# Patient Record
Sex: Female | Born: 1975
Health system: Southern US, Community
[De-identification: ages and names within clinical notes are randomized; demographics above are authoritative.]

## PROBLEM LIST (undated history)

## (undated) DIAGNOSIS — R51 Headache: Secondary | ICD-10-CM

## (undated) DIAGNOSIS — E7800 Pure hypercholesterolemia, unspecified: Secondary | ICD-10-CM

## (undated) DIAGNOSIS — G35D Multiple sclerosis, unspecified: Secondary | ICD-10-CM

## (undated) DIAGNOSIS — F419 Anxiety disorder, unspecified: Secondary | ICD-10-CM

## (undated) DIAGNOSIS — T7840XA Allergy, unspecified, initial encounter: Secondary | ICD-10-CM

## (undated) DIAGNOSIS — K219 Gastro-esophageal reflux disease without esophagitis: Secondary | ICD-10-CM

## (undated) DIAGNOSIS — R519 Headache, unspecified: Secondary | ICD-10-CM

## (undated) HISTORY — DX: Anxiety disorder, unspecified: F41.9

## (undated) HISTORY — DX: Gastro-esophageal reflux disease without esophagitis: K21.9

## (undated) HISTORY — DX: Headache, unspecified: R51.9

## (undated) HISTORY — DX: Allergy, unspecified, initial encounter: T78.40XA

## (undated) HISTORY — DX: Multiple sclerosis, unspecified: G35.D

## (undated) HISTORY — DX: Headache: R51

## (undated) HISTORY — DX: Pure hypercholesterolemia, unspecified: E78.00

---

## 2008-06-07 HISTORY — PX: LAPAROTOMY: SHX154

## 2008-11-28 ENCOUNTER — Ambulatory Visit: Payer: Self-pay | Admitting: Family Medicine

## 2008-11-29 ENCOUNTER — Ambulatory Visit: Payer: Self-pay | Admitting: Family Medicine

## 2008-12-03 ENCOUNTER — Ambulatory Visit: Payer: Self-pay | Admitting: Gynecologic Oncology

## 2008-12-05 ENCOUNTER — Ambulatory Visit: Payer: Self-pay | Admitting: Gynecologic Oncology

## 2008-12-13 ENCOUNTER — Ambulatory Visit: Payer: Self-pay | Admitting: Gynecologic Oncology

## 2008-12-16 ENCOUNTER — Ambulatory Visit: Payer: Self-pay | Admitting: Gynecologic Oncology

## 2008-12-17 ENCOUNTER — Inpatient Hospital Stay: Payer: Self-pay | Admitting: Surgery

## 2009-01-05 ENCOUNTER — Ambulatory Visit: Payer: Self-pay | Admitting: Gynecologic Oncology

## 2009-01-07 ENCOUNTER — Ambulatory Visit: Payer: Self-pay | Admitting: Gynecologic Oncology

## 2009-04-23 ENCOUNTER — Ambulatory Visit: Payer: Self-pay | Admitting: Family Medicine

## 2009-07-08 ENCOUNTER — Ambulatory Visit: Payer: Self-pay | Admitting: Gynecologic Oncology

## 2009-08-05 ENCOUNTER — Ambulatory Visit: Payer: Self-pay | Admitting: Gynecologic Oncology

## 2010-01-05 ENCOUNTER — Ambulatory Visit: Payer: Self-pay | Admitting: Gynecologic Oncology

## 2010-01-06 ENCOUNTER — Ambulatory Visit: Payer: Self-pay | Admitting: Gynecologic Oncology

## 2010-02-05 ENCOUNTER — Ambulatory Visit: Payer: Self-pay | Admitting: Gynecologic Oncology

## 2011-01-12 ENCOUNTER — Ambulatory Visit: Payer: Self-pay | Admitting: Gynecologic Oncology

## 2011-02-06 ENCOUNTER — Ambulatory Visit: Payer: Self-pay | Admitting: Gynecologic Oncology

## 2011-04-21 ENCOUNTER — Ambulatory Visit: Payer: Self-pay

## 2011-08-30 ENCOUNTER — Other Ambulatory Visit: Payer: Self-pay | Admitting: Physician Assistant

## 2012-01-18 ENCOUNTER — Ambulatory Visit: Payer: Self-pay | Admitting: Gynecologic Oncology

## 2012-02-06 ENCOUNTER — Ambulatory Visit: Payer: Self-pay | Admitting: Gynecologic Oncology

## 2013-01-05 ENCOUNTER — Ambulatory Visit: Payer: Self-pay | Admitting: Gynecologic Oncology

## 2013-02-06 ENCOUNTER — Ambulatory Visit: Payer: Self-pay | Admitting: Gynecologic Oncology

## 2013-03-07 ENCOUNTER — Ambulatory Visit: Payer: Self-pay | Admitting: Gynecologic Oncology

## 2013-05-11 ENCOUNTER — Other Ambulatory Visit: Payer: Self-pay | Admitting: Physician Assistant

## 2013-05-11 LAB — URINALYSIS, COMPLETE
Bilirubin,UR: NEGATIVE
Leukocyte Esterase: NEGATIVE
Ph: 5 (ref 4.5–8.0)
Protein: NEGATIVE
RBC,UR: 2 /HPF (ref 0–5)
Squamous Epithelial: 18
WBC UR: 4 /HPF (ref 0–5)

## 2015-07-31 ENCOUNTER — Telehealth: Payer: 59 | Admitting: Physician Assistant

## 2015-07-31 DIAGNOSIS — R399 Unspecified symptoms and signs involving the genitourinary system: Secondary | ICD-10-CM | POA: Diagnosis not present

## 2015-07-31 MED ORDER — FLUCONAZOLE 150 MG PO TABS: 150.0000 mg | ORAL_TABLET | Freq: Once | ORAL | Status: DC

## 2015-07-31 MED ORDER — CIPROFLOXACIN HCL 500 MG PO TABS: 500.0000 mg | ORAL_TABLET | Freq: Two times a day (BID) | ORAL | Status: DC

## 2015-07-31 MED FILL — FLUCONAZOLE 150 MG TABLET: 150 | 1 days supply | Qty: 1 | Fill #0

## 2015-07-31 MED FILL — CIPROFLOXACIN HCL 500 MG TA: 500 | 5 days supply | Qty: 10 | Fill #0

## 2015-07-31 NOTE — Progress Notes (Signed)
We are sorry that you are not feeling well.  Here is how we plan to help!  Based on what you shared with me it looks like you most likely have a simple urinary tract infection.  A UTI (Urinary Tract Infection) is a bacterial infection of the bladder.  Most cases of urinary tract infections are simple to treat but a key part of your care is to encourage you to drink plenty of fluids and watch your symptoms carefully.  I have prescribed Ciprofloxacin 500 mg twice a day for 5 days.  Your symptoms should gradually improve. Call us if the burning in your urine worsens, you develop worsening fever, back pain or pelvic pain or if your symptoms do not resolve after completing the antibiotic. I have also sent in a Diflucan to help prevent yeast from antibiotic use.  Urinary tract infections can be prevented by drinking plenty of water to keep your body hydrated.  Also be sure when you wipe, wipe from front to back and don't hold it in!  If possible, empty your bladder every 4 hours.  Your e-visit answers were reviewed by a board certified advanced clinical practitioner to complete your personal care plan.  Depending on the condition, your plan could have included both over the counter or prescription medications.  If there is a problem please reply  once you have received a response from your provider.  Your safety is important to [place].  If you have drug allergies check your prescription carefully.    You can use MyChart to ask questions about today's visit, request a non-urgent call back, or ask for a work or school excuse for 24 hours related to this e-Visit. If it has been greater than 24 hours you will need to follow up with your provider, or enter a new e-Visit to address those concerns.   You will get an e-mail in the next two days asking about your experience.  I hope that your e-visit has been valuable and will speed your recovery. Thank you for using e-visits.

## 2015-08-01 DIAGNOSIS — H5203 Hypermetropia, bilateral: Secondary | ICD-10-CM | POA: Diagnosis not present

## 2015-09-15 ENCOUNTER — Telehealth: Payer: 59 | Admitting: Family

## 2015-09-15 DIAGNOSIS — J019 Acute sinusitis, unspecified: Secondary | ICD-10-CM | POA: Diagnosis not present

## 2015-09-15 MED ORDER — AMOXICILLIN-POT CLAVULANATE 875-125 MG PO TABS: 1.0000 | ORAL_TABLET | Freq: Two times a day (BID) | ORAL | Status: DC

## 2015-09-15 MED ORDER — FLUCONAZOLE 150 MG PO TABS: 150.0000 mg | ORAL_TABLET | Freq: Once | ORAL | Status: DC

## 2015-09-15 MED FILL — AMOX TR-K CLV 875-125 MG TA: 875-125 | 7 days supply | Qty: 14 | Fill #0

## 2015-09-15 MED FILL — FLUCONAZOLE 150 MG TABLET: 150 | 1 days supply | Qty: 1 | Fill #0

## 2015-09-15 NOTE — Addendum Note (Signed)
Addended by: Evelina Dun A on: 09/15/2015 10:16 AM   Modules accepted: Orders

## 2015-09-15 NOTE — Progress Notes (Signed)

## 2015-10-31 ENCOUNTER — Encounter: Payer: Self-pay | Admitting: Physician Assistant

## 2015-10-31 ENCOUNTER — Ambulatory Visit (INDEPENDENT_AMBULATORY_CARE_PROVIDER_SITE_OTHER): Payer: 59 | Admitting: Physician Assistant

## 2015-10-31 VITALS — BP 108/70 | HR 93 | Temp 98.2°F | Resp 16 | Wt 164.4 lb

## 2015-10-31 DIAGNOSIS — Z7189 Other specified counseling: Secondary | ICD-10-CM | POA: Diagnosis not present

## 2015-10-31 DIAGNOSIS — Z0000 Encounter for general adult medical examination without abnormal findings: Secondary | ICD-10-CM

## 2015-10-31 DIAGNOSIS — E7800 Pure hypercholesterolemia, unspecified: Secondary | ICD-10-CM

## 2015-10-31 DIAGNOSIS — G5601 Carpal tunnel syndrome, right upper limb: Secondary | ICD-10-CM | POA: Diagnosis not present

## 2015-10-31 DIAGNOSIS — A499 Bacterial infection, unspecified: Secondary | ICD-10-CM

## 2015-10-31 DIAGNOSIS — Z136 Encounter for screening for cardiovascular disorders: Secondary | ICD-10-CM | POA: Diagnosis not present

## 2015-10-31 DIAGNOSIS — Z7689 Persons encountering health services in other specified circumstances: Secondary | ICD-10-CM

## 2015-10-31 DIAGNOSIS — R6889 Other general symptoms and signs: Secondary | ICD-10-CM | POA: Diagnosis not present

## 2015-10-31 DIAGNOSIS — Z13220 Encounter for screening for lipoid disorders: Secondary | ICD-10-CM

## 2015-10-31 DIAGNOSIS — N760 Acute vaginitis: Secondary | ICD-10-CM

## 2015-10-31 DIAGNOSIS — B9689 Other specified bacterial agents as the cause of diseases classified elsewhere: Secondary | ICD-10-CM

## 2015-10-31 NOTE — Patient Instructions (Signed)

## 2015-10-31 NOTE — Progress Notes (Signed)
Patient: Erica Fowler Female    DOB: [birth year]   40 y.o.   MRN: [id number] Visit Date: 10/31/2015  Today's Provider: Mar Daring, PA-C   Chief Complaint  Patient presents with  . New Patient (Initial Visit)   Subjective:    HPI Patient comes in today to establish care as a new patient. She has been seen by Gennie Alma, NP at [facility].   She also has concerns about her right hand hurting. She reports that she is in front of the computer the whole day. In the past few weeks her hand hurts sometimes and she reports feeling tingling and numbness over the 1st - 3rd fingers. This has awoken her at night. She also sleeps with her arm above her head and has noticed that this makes it worse. She also reports arm numbness occasionally when sleeping that way, "sometimes I can't lift my arm off the pillow when I wake up." Her hand bothers her the most when driving.  She does also suffer from seasonal allergies. She uses loratadine as needed for allergy symptoms.   Heat intolerance and fatigue she questions if she may be having menopausal signs. The heat intolerance if mostly if she is outdoors on a hot day. She will have to go back inside because she gets so uncomfortable.   She also reports having frequent episodes of BV that occur most often following intercourse. She was given metrogel by Gennie Alma, NP for her to use as needed when she has an episode. This has been helping.    Allergies  Allergen Reactions  . Sulfa Antibiotics    Previous Medications   ACETAMINOPHEN (TYLENOL) 500 MG TABLET    Take 500 mg by mouth every 6 (six) hours as needed.   CYANOCOBALAMIN (VITAMIN B 12 PO)    Take by mouth.   LORATADINE (CLARITIN) 10 MG TABLET    Take 10 mg by mouth daily.   PSEUDOEPHEDRINE HCL (SUDAFED PO)    Take by mouth.    Review of Systems  Constitutional: Positive for fatigue.  HENT: Positive for sneezing. Negative for congestion, ear pain, sinus pressure and  sore throat.   Eyes: Positive for discharge.  Respiratory: Negative.   Cardiovascular: Negative.   Gastrointestinal: Negative.   Endocrine: Positive for heat intolerance.  Genitourinary: Negative.   Musculoskeletal: Negative.   Allergic/Immunologic: Positive for environmental allergies.  Neurological: Positive for light-headedness, numbness (right hand) and headaches. Negative for dizziness and weakness.  Hematological: Negative.   Psychiatric/Behavioral: Negative.     Social History  Substance Use Topics  . Smoking status: Current Every Day Smoker -- 0.50 packs/day    Types: Cigarettes  . Smokeless tobacco: Not on file  . Alcohol Use: Yes     Comment: occasional   Objective:   BP 108/70 mmHg  Pulse 93  Temp(Src) 98.2 F (36.8 C) (Oral)  Resp 16  Wt 164 lb 6.4 oz (74.571 kg)  Physical Exam  Constitutional: She is oriented to person, place, and time. She appears well-developed and well-nourished. No distress.  HENT:  Head: Normocephalic and atraumatic.  Right Ear: External ear normal.  Left Ear: External ear normal.  Nose: Nose normal.  Mouth/Throat: Oropharynx is clear and moist. No oropharyngeal exudate.  Eyes: Conjunctivae and EOM are normal. Pupils are equal, round, and reactive to light. Right eye exhibits no discharge. Left eye exhibits no discharge. No scleral icterus.  Neck: Normal range of motion. Neck supple. No JVD  present. No tracheal deviation present. No thyromegaly present.  Cardiovascular: Normal rate, regular rhythm, normal heart sounds and intact distal pulses.  Exam reveals no gallop and no friction rub.   No murmur heard. Pulmonary/Chest: Effort normal and breath sounds normal. No respiratory distress. She has no wheezes. She has no rales. She exhibits no tenderness.  Abdominal: Soft. Bowel sounds are normal. She exhibits no distension and no mass. There is no tenderness. There is no rebound and no guarding.  Musculoskeletal: Normal range of motion. She  exhibits no edema or tenderness.       Right wrist: Normal.       Left wrist: Normal.       Right hand: Normal.       Left hand: Normal.  + Tinel and Phalen on right hand  Lymphadenopathy:    She has no cervical adenopathy.  Neurological: She is alert and oriented to person, place, and time.  Skin: Skin is warm and dry. No rash noted. She is not diaphoretic.  Psychiatric: She has a normal mood and affect. Her behavior is normal. Judgment and thought content normal.  Vitals reviewed.       Assessment & Plan:     1. Establishing care with new doctor, encounter for No previous PCP. Previously seen by Gennie Alma, NP at [facility]. We'll check labs being that she has not had any labs checked in a long time and I will follow-up with her pending these results. If labs are within normal limits and stable I will see her back in 6 months for her annual physical exam. - CBC with Differential - Comprehensive Metabolic Panel (CMET)  2. Carpal tunnel syndrome, right Will start with conservative therapy of wrist brace (cock-up splint) for her to wear at nighttime. She may use his brace as well during the day when she has to be at her computer a lot. She may also use anti-inflammatories as needed. She is to call the office if symptoms do not improve with conservative therapy or if they worsen. Will consider referral to orthopedics for nerve conduction study and further evaluation of carpal tunnel if no improvement.  3. Heat intolerance I will check thyroid level as below to make sure that her thyroid is not playing a factor into her heat intolerance. I will follow-up with her pending these results. - TSH  4. Encounter for lipid screening for cardiovascular disease Family history of cardiovascular disease and elevated cholesterol. I will check her cholesterol level as below and follow-up pending these results. - Lipid Profile  5. BV (bacterial vaginosis) Currently stable. She does use  MetroGel as needed for any flareups. She is to call the office if she has any issues or worsening symptoms. - metroNIDAZOLE (METROGEL VAGINAL) 0.75 % vaginal gel; Place 1 Applicatorful vaginally 2 (two) times daily.  Dispense: 70 g; Refill: 0       Mar Daring, PA-C  [facility]

## 2015-11-01 LAB — COMPREHENSIVE METABOLIC PANEL
A/G RATIO: 1.6 (ref 1.2–2.2)
ALT: 19 IU/L (ref 0–32)
AST: 20 IU/L (ref 0–40)
Albumin: 4.6 g/dL (ref 3.5–5.5)
Alkaline Phosphatase: 49 IU/L (ref 39–117)
BILIRUBIN TOTAL: 0.3 mg/dL (ref 0.0–1.2)
BUN / CREAT RATIO: 15 (ref 9–23)
BUN: 11 mg/dL (ref 6–20)
CHLORIDE: 100 mmol/L (ref 96–106)
CO2: 23 mmol/L (ref 18–29)
Calcium: 9.6 mg/dL (ref 8.7–10.2)
Creatinine, Ser: 0.71 mg/dL (ref 0.57–1.00)
GFR calc non Af Amer: 108 mL/min/{1.73_m2} (ref >59)
GFR, EST AFRICAN AMERICAN: 125 mL/min/{1.73_m2} (ref >59)
GLOBULIN, TOTAL: 2.9 g/dL (ref 1.5–4.5)
Glucose: 89 mg/dL (ref 65–99)
POTASSIUM: 4.2 mmol/L (ref 3.5–5.2)
SODIUM: 140 mmol/L (ref 134–144)
TOTAL PROTEIN: 7.5 g/dL (ref 6.0–8.5)

## 2015-11-01 LAB — CBC WITH DIFFERENTIAL/PLATELET
BASOS ABS: 0 10*3/uL (ref 0.0–0.2)
Basos: 0 %
EOS (ABSOLUTE): 0.1 10*3/uL (ref 0.0–0.4)
Eos: 2 %
Hematocrit: 44.9 % (ref 34.0–46.6)
Hemoglobin: 14.9 g/dL (ref 11.1–15.9)
IMMATURE GRANS (ABS): 0 10*3/uL (ref 0.0–0.1)
IMMATURE GRANULOCYTES: 0 %
LYMPHS: 26 %
Lymphocytes Absolute: 2 10*3/uL (ref 0.7–3.1)
MCH: 28.6 pg (ref 26.6–33.0)
MCHC: 33.2 g/dL (ref 31.5–35.7)
MCV: 86 fL (ref 79–97)
Monocytes Absolute: 0.5 10*3/uL (ref 0.1–0.9)
Monocytes: 7 %
NEUTROS ABS: 4.9 10*3/uL (ref 1.4–7.0)
NEUTROS PCT: 65 %
PLATELETS: 311 10*3/uL (ref 150–379)
RBC: 5.21 x10E6/uL (ref 3.77–5.28)
RDW: 13.6 % (ref 12.3–15.4)
WBC: 7.6 10*3/uL (ref 3.4–10.8)

## 2015-11-01 LAB — LIPID PANEL
CHOL/HDL RATIO: 5 ratio — AB (ref 0.0–4.4)
Cholesterol, Total: 210 mg/dL — ABNORMAL HIGH (ref 100–199)
HDL: 42 mg/dL (ref >39)
LDL CALC: 141 mg/dL — AB (ref 0–99)
TRIGLYCERIDES: 135 mg/dL (ref 0–149)
VLDL Cholesterol Cal: 27 mg/dL (ref 5–40)

## 2015-11-01 LAB — TSH: TSH: 1.28 u[IU]/mL (ref 0.450–4.500)

## 2015-11-04 ENCOUNTER — Encounter: Payer: Self-pay | Admitting: Physician Assistant

## 2015-11-04 ENCOUNTER — Telehealth: Payer: Self-pay

## 2015-11-04 MED ORDER — SIMVASTATIN 10 MG PO TABS
10.0000 mg | ORAL_TABLET | Freq: Every day | ORAL | Status: DC
Start: 2015-11-04 — End: 2016-05-07

## 2015-11-04 NOTE — Addendum Note (Signed)
Addended by: Mar Daring on: 11/04/2015 09:02 AM   Modules accepted: Orders

## 2015-11-04 NOTE — Telephone Encounter (Signed)
-----   Message from Mar Daring, [place] sent at 11/04/2015  9:01 AM EDT ----- All labs are within normal limits and stable with exception of cholesterol which is elevated. I do recommend starting a low dose cholesterol lowering medication at this time since your cardiovascular event risk is elevated to almost 2x higher than average risk with your current numbers. I will send this in to the pharmacy on file and we will recheck cholesterol in 6 months. Also make sure to limit foods high in fat and cholesterol from diet and add physical activity to equal 150 min/week minimum as advised by the [employer].   Thanks! -JB

## 2015-11-04 NOTE — Telephone Encounter (Signed)
Patient was advised as below. Patient reports that she does not want to start a cholesterol medication at this time. She reports that she will also inform you via My Chart.

## 2015-11-04 NOTE — Telephone Encounter (Signed)
Responded via mychart. Will try fish oil or krill oil with lifestyle changes and recheck in 6 months.

## 2015-11-21 ENCOUNTER — Telehealth: Payer: Self-pay

## 2015-11-21 NOTE — Telephone Encounter (Signed)
Patient called and states she was here on May 26th to get established with [facility]. She thought this was a CPE also and that her labs would be covered but she just got a bill for $230 for office visit and labs they were not covered. Patient did not realize this was going to happen she thought this was a CPE. Per office note it was get established and discussed issues she was having but the labs that were ordered are usually for CPE and she thought that's what she had. I advised patient I would check with manager to see how to proceed because I am not sure of the steps and if we are able to correct this. Please review-aa

## 2015-11-28 ENCOUNTER — Encounter: Payer: Self-pay | Admitting: Physician Assistant

## 2015-11-28 NOTE — Telephone Encounter (Signed)
  Updated code. Will see. If no change call [facility].

## 2015-11-28 NOTE — Addendum Note (Signed)
Addended by: Mar Daring on: 11/28/2015 10:43 AM   Modules accepted: Level of Service

## 2016-01-14 ENCOUNTER — Telehealth: Payer: 59 | Admitting: Nurse Practitioner

## 2016-01-14 DIAGNOSIS — J0100 Acute maxillary sinusitis, unspecified: Secondary | ICD-10-CM | POA: Diagnosis not present

## 2016-01-14 DIAGNOSIS — J038 Acute tonsillitis due to other specified organisms: Secondary | ICD-10-CM | POA: Diagnosis not present

## 2016-01-14 MED ORDER — AMOXICILLIN-POT CLAVULANATE 875-125 MG PO TABS: 1.0000 | ORAL_TABLET | Freq: Two times a day (BID) | ORAL | 0 refills | Status: DC

## 2016-01-14 MED ORDER — FLUCONAZOLE 150 MG PO TABS: 150.0000 mg | ORAL_TABLET | Freq: Once | ORAL | 0 refills | Status: AC

## 2016-01-14 NOTE — Progress Notes (Signed)

## 2016-01-14 NOTE — Addendum Note (Signed)
Addended by: Chevis Pretty on: 01/14/2016 09:55 AM   Modules accepted: Orders

## 2016-04-21 ENCOUNTER — Telehealth: Payer: Self-pay | Admitting: Physician Assistant

## 2016-04-21 NOTE — Telephone Encounter (Signed)
Yes she can be scheduled as a follow-up for bacterial vaginosis and I can collect Pap at that time.

## 2016-04-21 NOTE — Telephone Encounter (Signed)
Pt called to schedule PAP with Tawanna Sat and stated that Tawanna Sat advised her at her new pt OV on 10/31/15 she could come back in for her PAP. I asked pt if she was wanting to schedule a CPE. Pt stated her CPE was done at the 10/31/15 visit. After looking at the [place] note with Joseline, I advised that the visit was establishing care and she was expected to come in for CPE 6 months after visit. Pt advised that she had trouble with the way the claim was coded for that visit and thinks it was changed to CPE. I advised pt I would send back a message and see how we needed to schedule this. Pt also advised that she thinks she might have vaginosis. I offered pt a regular office visit to discuss this and she stated that she couldn't come in tomorrow or Friday and she would just wait for the call back.  Arbie Cookey looked at the visit and it was changed to CPE. How should this be scheduled? Can we just see pt for the current issue and include a PAP? Please advise. Thanks TNP

## 2016-04-21 NOTE — Telephone Encounter (Signed)
Called pt to schedule appt. Pt stated that she has an appt with OBGYN to discuss vaginosis and would like Jenni to do a PAP. I went ahead and scheduled PAP for 05/07/16. Thanks TNP

## 2016-04-21 NOTE — Telephone Encounter (Signed)
Please review.  Thanks,  -Joseline 

## 2016-04-21 NOTE — Telephone Encounter (Signed)
Tried calling pt. No answer and voicemail not set up. Will try again later. Thanks TNP

## 2016-04-22 ENCOUNTER — Encounter: Payer: Self-pay | Admitting: Obstetrics and Gynecology

## 2016-04-22 ENCOUNTER — Ambulatory Visit (INDEPENDENT_AMBULATORY_CARE_PROVIDER_SITE_OTHER): Payer: 59 | Admitting: Obstetrics and Gynecology

## 2016-04-22 VITALS — BP 116/77 | HR 86 | Wt 157.9 lb

## 2016-04-22 DIAGNOSIS — B9689 Other specified bacterial agents as the cause of diseases classified elsewhere: Secondary | ICD-10-CM

## 2016-04-22 DIAGNOSIS — N760 Acute vaginitis: Secondary | ICD-10-CM

## 2016-04-22 MED ORDER — HYLAFEM VA SUPP: 1.0000 | VAGINAL | 4 refills | Status: DC | PRN

## 2016-04-22 NOTE — Progress Notes (Signed)
Subjective:     Patient ID: Erica Fowler, female   DOB: February 10, 1976, 40 y.o.   MRN: [id number]  HPI Reports increase in vaginal discharge with slight odor x 1 week. Has been using Metrogel since May for recurrent BV (noted after menses), but then gets a yeast infection. States menses irregular and light since IUD placed 3 years ago. Denies pain or bleeding with sex, urinary symptoms or changes.  Review of Systems Negative except stated in HPI.    Objective:   Physical Exam A&O x4 Well groomed female in no distress Blood pressure 116/77, pulse 86, weight 157 lb 14.4 oz (71.6 kg). Pelvic exam: normal external genitalia, vulva, vagina, cervix, uterus and adnexa, CERVIX: normal appearing cervix without discharge or lesions, cervical discharge present - white, scant and thin, IUD string noted, WET MOUNT done - results: negative for pathogens, normal epithelial cells, KOH done, clue cells.     Assessment:     BV    Plan:     Hylefem capsule sample given and instructed on use, recommend trying it after menses and prn to see if better resolution without causing yeast infections. RTC as needed.  Melody [place], CNM

## 2016-05-07 ENCOUNTER — Encounter: Payer: Self-pay | Admitting: Physician Assistant

## 2016-05-07 ENCOUNTER — Ambulatory Visit (INDEPENDENT_AMBULATORY_CARE_PROVIDER_SITE_OTHER): Payer: 59 | Admitting: Physician Assistant

## 2016-05-07 VITALS — BP 114/80 | HR 84 | Temp 98.2°F | Resp 16 | Wt 153.0 lb

## 2016-05-07 DIAGNOSIS — Z124 Encounter for screening for malignant neoplasm of cervix: Secondary | ICD-10-CM | POA: Diagnosis not present

## 2016-05-07 NOTE — Patient Instructions (Signed)

## 2016-05-07 NOTE — Progress Notes (Signed)
Patient: Erica Fowler Female    DOB: [birth year]   40 y.o.   MRN: [id number] Visit Date: 05/07/2016  Today's Provider: Mar Daring, PA-C   Chief Complaint  Patient presents with  . Cervical Cancer Screening   Subjective:    HPI   Cervical Cancer Screening Pt comes in today for a pap smear. Pt reports she has never had an abnormal pap. Pt believes her last pap was January 2016. Pt sees [facility], Melody Shambley, CNM, who follows pt's vaginitis.  She was recently started on boric acid suppositories. Since starting the suppositories she has noticed improvement in symptoms.                          Allergies  Allergen Reactions  . Sulfa Antibiotics      Current Outpatient Prescriptions:  .  acetaminophen (TYLENOL) 500 MG tablet, Take 500 mg by mouth every 6 (six) hours as needed., Disp: , Rfl:  .  Cyanocobalamin (VITAMIN B 12 PO), Take by mouth., Disp: , Rfl:  .  Homeopathic Products (HYLAFEM) SUPP, Place 1 capsule vaginally as needed., Disp: 6 suppository, Rfl: 4 .  levonorgestrel (MIRENA) 20 MCG/24HR IUD, 1 each by Intrauterine route once., Disp: , Rfl:  .  loratadine (CLARITIN) 10 MG tablet, Take 10 mg by mouth daily., Disp: , Rfl:  .  Pseudoephedrine HCl (SUDAFED PO), Take by mouth., Disp: , Rfl:   Review of Systems  Constitutional: Negative for activity change, appetite change, chills, diaphoresis, fatigue, fever and unexpected weight change.  Respiratory: Negative for cough, chest tightness and shortness of breath.   Cardiovascular: Negative for chest pain, palpitations and leg swelling.  Gastrointestinal: Negative for abdominal pain.  Genitourinary: Negative for dysuria, flank pain, menstrual problem, vaginal discharge and vaginal pain.  Musculoskeletal: Negative for back pain.    Social History  Substance Use Topics  . Smoking status: Former Smoker    Packs/day: 0.50    Years: 20.00    Types: Cigarettes    Quit date: 01/05/2016  . Smokeless  tobacco: Never Used  . Alcohol use Yes     Comment: occasional   Objective:   BP 114/80 (BP Location: Right Arm, Patient Position: Sitting, Cuff Size: Normal)   Pulse 84   Temp 98.2 F (36.8 C) (Oral)   Resp 16   Wt 153 lb (69.4 kg)   LMP 05/01/2016   Physical Exam  Constitutional: She appears well-developed and well-nourished. No distress.  Cardiovascular: Normal rate, regular rhythm and normal heart sounds.  Exam reveals no gallop and no friction rub.   No murmur heard. Pulmonary/Chest: Effort normal and breath sounds normal. No respiratory distress. She has no wheezes. She has no rales.  Abdominal: Hernia confirmed negative in the right inguinal area and confirmed negative in the left inguinal area.  Genitourinary: Rectum normal and uterus normal. There is no rash, tenderness, lesion or injury on the right labia. There is no rash, tenderness, lesion or injury on the left labia. Uterus is not deviated, not enlarged, not fixed and not tender. Cervix exhibits no motion tenderness, no discharge and no friability. Right adnexum displays no mass, no tenderness and no fullness. Left adnexum displays no mass, no tenderness and no fullness. No erythema, tenderness or bleeding in the vagina. No foreign body in the vagina. No signs of injury around the vagina. Vaginal discharge (thick, milky discharge in vaginal vault) found.    Genitourinary  Comments: Right ovary surgically absent  Lymphadenopathy:       Right: No inguinal adenopathy present.       Left: No inguinal adenopathy present.  Skin: She is not diaphoretic.  Vitals reviewed.      Assessment & Plan:     1. Cervical cancer screening Pap collected today. Will send as below and f/u pending results. - Pap IG and HPV (high risk) DNA detection     Patient seen and examined by Fenton Malling, PA, and note scribed by Renaldo Fiddler, CMA.   Mar Daring, PA-C  [facility]

## 2016-05-11 LAB — PAP IG AND HPV HIGH-RISK
HPV, HIGH-RISK: NEGATIVE
PAP SMEAR COMMENT: 0

## 2016-06-17 ENCOUNTER — Telehealth: Payer: 59 | Admitting: Family

## 2016-06-17 DIAGNOSIS — J111 Influenza due to unidentified influenza virus with other respiratory manifestations: Secondary | ICD-10-CM

## 2016-06-17 MED ORDER — OSELTAMIVIR PHOSPHATE 75 MG PO CAPS: 75.0000 mg | ORAL_CAPSULE | Freq: Two times a day (BID) | ORAL | 0 refills | Status: DC

## 2016-06-17 NOTE — Progress Notes (Signed)
E visit for Flu like symptoms   We are sorry that you are not feeling well.  Here is how we plan to help! Based on what you have shared with me it looks like you may have possible exposure to a virus that causes influenza.  Influenza or "the flu" is   an infection caused by a respiratory virus. The flu virus is highly contagious and persons who did not receive their yearly flu vaccination may "catch" the flu from close contact.  We have anti-viral medications to treat the viruses that cause this infection. They are not a "cure" and only shorten the course of the infection. These prescriptions are most effective when they are given within the first 2 days of "flu" symptoms. Antiviral medication are indicated if you have a high risk of complications from the flu. You should  also consider an antiviral medication if you are in close contact with someone who is at risk. These medications can help patients avoid complications from the flu  but have side effects that you should know. Possible side effects from Tamiflu or oseltamivir include nausea, vomiting, diarrhea, dizziness, headaches, eye redness, sleep problems or other respiratory symptoms. You should not take Tamiflu if you have an allergy to oseltamivir or any to the ingredients in Tamiflu.  After our telephone call...here are the recommendations:   Based upon your symptoms and potential risk factors I have prescribed Oseltamivir (Tamiflu).  It has been sent to your designated pharmacy.  You will take one 75 mg capsule orally twice a day for the next 5 days.  ANYONE WHO HAS FLU SYMPTOMS SHOULD: . Stay home. The flu is highly contagious and going out or to work exposes others! . Be sure to drink plenty of fluids. Water is fine as well as fruit juices, sodas and electrolyte beverages. You may want to stay away from caffeine or alcohol. If you are nauseated, try taking small sips of liquids. How do you know if you are getting enough fluid? Your urine  should be a pale yellow or almost colorless. . Get rest. . Taking a steamy shower or using a humidifier may help nasal congestion and ease sore throat pain. Using a saline nasal spray works much the same way. . Cough drops, hard candies and sore throat lozenges may ease your cough. . Line up a caregiver. Have someone check on you regularly.   GET HELP RIGHT AWAY IF: . You cannot keep down liquids or your medications. . You become short of breath . Your fell like you are going to pass out or loose consciousness. . Your symptoms persist after you have completed your treatment plan MAKE SURE YOU   Understand these instructions.  Will watch your condition.  Will get help right away if you are not doing well or get worse.  Your e-visit answers were reviewed by a board certified advanced clinical practitioner to complete your personal care plan.  Depending on the condition, your plan could have included both over the counter or prescription medications.  If there is a problem please reply  once you have received a response from your provider.  Your safety is important to [place].  If you have drug allergies check your prescription carefully.    You can use MyChart to ask questions about today's visit, request a non-urgent call back, or ask for a work or school excuse for 24 hours related to this e-Visit. If it has been greater than 24 hours you will need to  follow up with your provider, or enter a new e-Visit to address those concerns.  You will get an e-mail in the next two days asking about your experience.  I hope that your e-visit has been valuable and will speed your recovery. Thank you for using e-visits.   

## 2016-08-06 DIAGNOSIS — H524 Presbyopia: Secondary | ICD-10-CM | POA: Diagnosis not present

## 2017-02-02 ENCOUNTER — Encounter: Payer: Self-pay | Admitting: Physician Assistant

## 2017-02-02 ENCOUNTER — Ambulatory Visit (INDEPENDENT_AMBULATORY_CARE_PROVIDER_SITE_OTHER): Payer: 59 | Admitting: Physician Assistant

## 2017-02-02 VITALS — BP 100/72 | HR 88 | Temp 98.1°F | Resp 16 | Wt 132.2 lb

## 2017-02-02 DIAGNOSIS — R309 Painful micturition, unspecified: Secondary | ICD-10-CM

## 2017-02-02 DIAGNOSIS — N3000 Acute cystitis without hematuria: Secondary | ICD-10-CM

## 2017-02-02 DIAGNOSIS — T3695XA Adverse effect of unspecified systemic antibiotic, initial encounter: Secondary | ICD-10-CM | POA: Diagnosis not present

## 2017-02-02 DIAGNOSIS — B379 Candidiasis, unspecified: Secondary | ICD-10-CM

## 2017-02-02 LAB — POCT URINALYSIS DIPSTICK
BILIRUBIN UA: NEGATIVE
Glucose, UA: NEGATIVE
Ketones, UA: NEGATIVE
NITRITE UA: NEGATIVE
PH UA: 7 (ref 5.0–8.0)
PROTEIN UA: NEGATIVE
Spec Grav, UA: 1.01 (ref 1.010–1.025)
UROBILINOGEN UA: 0.2 U/dL

## 2017-02-02 MED ORDER — FLUCONAZOLE 150 MG PO TABS: 150.0000 mg | ORAL_TABLET | Freq: Once | ORAL | 0 refills | Status: AC

## 2017-02-02 MED ORDER — CIPROFLOXACIN HCL 500 MG PO TABS: 500.0000 mg | ORAL_TABLET | Freq: Two times a day (BID) | ORAL | 0 refills | Status: DC

## 2017-02-02 NOTE — Patient Instructions (Signed)

## 2017-02-02 NOTE — Progress Notes (Signed)
Patient: Erica Fowler Female    DOB: 1975-08-20   41 y.o.   MRN: [id number] Visit Date: 02/02/2017  Today's Provider: Mar Daring, PA-C   Chief Complaint  Patient presents with  . Urinary Tract Infection   Subjective:    HPI Urinary Tract Infection: Patient complains of burning with urination, frequency and urgency. She has had symptoms for 3 days. Patient also complains of back pain and vaginal discharge. Patient denies fever. Patient does have a history of recurrent UTI.  Patient does not have a history of pyelonephritis. She does have history of BV. She has been seen by GYN and was started on hylafem. She was unable to afford this so she has been using boric acid tabs prn during episodes of BV.      Allergies  Allergen Reactions  . Sulfa Antibiotics      Current Outpatient Prescriptions:  .  acetaminophen (TYLENOL) 500 MG tablet, Take 500 mg by mouth every 6 (six) hours as needed., Disp: , Rfl:  .  Cyanocobalamin (VITAMIN B 12 PO), Take by mouth., Disp: , Rfl:  .  levonorgestrel (MIRENA) 20 MCG/24HR IUD, 1 each by Intrauterine route once., Disp: , Rfl:  .  loratadine (CLARITIN) 10 MG tablet, Take 10 mg by mouth daily., Disp: , Rfl:  .  Multiple Vitamin (MULTIVITAMIN) tablet, Take 1 tablet by mouth daily., Disp: , Rfl:  .  Pseudoephedrine HCl (SUDAFED PO), Take by mouth., Disp: , Rfl:   Review of Systems  Constitutional: Negative.   Respiratory: Negative.   Cardiovascular: Negative.   Gastrointestinal: Negative.   Genitourinary: Positive for dysuria, urgency and vaginal discharge.  Musculoskeletal: Positive for back pain.    Social History  Substance Use Topics  . Smoking status: Former Smoker    Packs/day: 0.50    Years: 20.00    Types: Cigarettes    Quit date: 01/05/2016  . Smokeless tobacco: Never Used  . Alcohol use Yes     Comment: occasional   Objective:   BP 100/72 (BP Location: Left Arm, Patient Position: Sitting, Cuff Size: Large)    Pulse 88   Temp 98.1 F (36.7 C) (Oral)   Resp 16   Wt 132 lb 3.2 oz (60 kg)   LMP 01/23/2017 (Exact Date)   SpO2 98%  Vitals:   02/02/17 1539  BP: 100/72  Pulse: 88  Resp: 16  Temp: 98.1 F (36.7 C)  TempSrc: Oral  SpO2: 98%  Weight: 132 lb 3.2 oz (60 kg)     Physical Exam  Constitutional: She is oriented to person, place, and time. She appears well-developed and well-nourished. No distress.  Cardiovascular: Normal rate, regular rhythm and normal heart sounds.  Exam reveals no gallop and no friction rub.   No murmur heard. Pulmonary/Chest: Effort normal and breath sounds normal. No respiratory distress. She has no wheezes. She has no rales.  Abdominal: Soft. Normal appearance and bowel sounds are normal. She exhibits no distension and no mass. There is no hepatosplenomegaly. There is tenderness in the suprapubic area. There is no rebound, no guarding and no CVA tenderness.  Neurological: She is alert and oriented to person, place, and time.  Skin: Skin is warm and dry. She is not diaphoretic.  Vitals reviewed.       Assessment & Plan:     1. Acute cystitis without hematuria Worsening symptoms. UA positive. Will treat empirically with Cipro Continue to push fluids. Urine sent for culture. Will follow  up pending C&S results. She is to call if symptoms do not improve or if they worsen.  - ciprofloxacin (CIPRO) 500 MG tablet; Take 1 tablet (500 mg total) by mouth 2 (two) times daily.  Dispense: 10 tablet; Refill: 0  2. Painful urination See above medical treatment plan. - POCT urinalysis dipstick - Urine Culture  3. Antibiotic-induced yeast infection Given prophylactically.  - fluconazole (DIFLUCAN) 150 MG tablet; Take 1 tablet (150 mg total) by mouth once.  Dispense: 1 tablet; Refill: 0       Mar Daring, PA-C  [facility]

## 2017-02-04 ENCOUNTER — Telehealth: Payer: Self-pay

## 2017-02-04 LAB — URINE CULTURE: Organism ID, Bacteria: NO GROWTH

## 2017-02-04 NOTE — Telephone Encounter (Signed)
Pt advised.   Thanks,   -Erica Fowler  

## 2017-02-04 NOTE — Telephone Encounter (Signed)
-----   Message from Mar Daring, [place] sent at 02/04/2017  8:35 AM EDT ----- Urine culture showed no bacterial growth. If symptoms have been improving with treatment continue until completed.

## 2017-02-16 ENCOUNTER — Ambulatory Visit (INDEPENDENT_AMBULATORY_CARE_PROVIDER_SITE_OTHER): Payer: 59 | Admitting: Physician Assistant

## 2017-02-16 ENCOUNTER — Encounter: Payer: Self-pay | Admitting: Physician Assistant

## 2017-02-16 VITALS — BP 110/70 | HR 72 | Temp 98.2°F | Resp 16 | Ht 63.0 in | Wt 131.2 lb

## 2017-02-16 DIAGNOSIS — Z131 Encounter for screening for diabetes mellitus: Secondary | ICD-10-CM | POA: Diagnosis not present

## 2017-02-16 DIAGNOSIS — Z136 Encounter for screening for cardiovascular disorders: Secondary | ICD-10-CM | POA: Diagnosis not present

## 2017-02-16 DIAGNOSIS — Z13220 Encounter for screening for lipoid disorders: Secondary | ICD-10-CM | POA: Diagnosis not present

## 2017-02-16 DIAGNOSIS — Z1231 Encounter for screening mammogram for malignant neoplasm of breast: Secondary | ICD-10-CM

## 2017-02-16 DIAGNOSIS — Z0000 Encounter for general adult medical examination without abnormal findings: Secondary | ICD-10-CM

## 2017-02-16 DIAGNOSIS — Z23 Encounter for immunization: Secondary | ICD-10-CM

## 2017-02-16 DIAGNOSIS — Z1239 Encounter for other screening for malignant neoplasm of breast: Secondary | ICD-10-CM

## 2017-02-16 NOTE — Progress Notes (Signed)
Patient: Erica Fowler, Female    DOB: 02-11-1976, 41 y.o.   MRN: [id number] Visit Date: 02/16/2017  Today's Provider: Mar Daring, PA-C   Chief Complaint  Patient presents with  . Annual Exam   Subjective:    Annual physical exam Erica Fowler is a 41 y.o. female who presents today for health maintenance and complete physical. She feels well. She reports exercising none. She reports she is sleeping fairly well.  Last CPE:10/31/15 Pap:05/07/16 Normal, HPV-Negative -----------------------------------------------------------------   Review of Systems  Constitutional: Positive for fatigue.  HENT: Negative.   Eyes: Negative.   Respiratory: Negative.   Cardiovascular: Negative.   Gastrointestinal: Negative.   Endocrine: Negative.   Genitourinary: Negative.   Musculoskeletal: Negative.   Skin: Negative.   Allergic/Immunologic: Negative.   Neurological: Positive for headaches.  Hematological: Negative.   Psychiatric/Behavioral: Positive for sleep disturbance.    Social History      She  reports that she quit smoking about 13 months ago. Her smoking use included Cigarettes. She has a 10.00 pack-year smoking history. She has never used smokeless tobacco. She reports that she drinks alcohol. She reports that she does not use drugs.       Social History   Social History  . Marital status: Married    Spouse name: N/A  . Number of children: N/A  . Years of education: N/A   Social History Main Topics  . Smoking status: Former Smoker    Packs/day: 0.50    Years: 20.00    Types: Cigarettes    Quit date: 01/05/2016  . Smokeless tobacco: Never Used  . Alcohol use Yes     Comment: occasional  . Drug use: No  . Sexual activity: Yes    Birth control/ protection: IUD     Comment: mirena   Other Topics Concern  . None   Social History Narrative  . None    Past Medical History:  Diagnosis Date  . Allergy   . GERD (gastroesophageal reflux disease)    . Headache      There are no active problems to display for this patient.   Past Surgical History:  Procedure Laterality Date  . LAPAROTOMY Left 2010    Family History        Family Status  Relation Status  . Father Alive  . Mother Alive        Her family history includes Cancer in her father; Heart disease in her father.     Allergies  Allergen Reactions  . Sulfa Antibiotics      Current Outpatient Prescriptions:  .  acetaminophen (TYLENOL) 500 MG tablet, Take 500 mg by mouth every 6 (six) hours as needed., Disp: , Rfl:  .  Cyanocobalamin (VITAMIN B 12 PO), Take by mouth., Disp: , Rfl:  .  levonorgestrel (MIRENA) 20 MCG/24HR IUD, 1 each by Intrauterine route once., Disp: , Rfl:  .  loratadine (CLARITIN) 10 MG tablet, Take 10 mg by mouth daily., Disp: , Rfl:  .  Multiple Vitamin (MULTIVITAMIN) tablet, Take 1 tablet by mouth daily., Disp: , Rfl:  .  Pseudoephedrine HCl (SUDAFED PO), Take by mouth., Disp: , Rfl:    Patient Care Team: Mar Daring, PA-C as PCP - General (Family Medicine)      Objective:   Vitals: BP 110/70 (BP Location: Right Arm, Patient Position: Sitting, Cuff Size: Normal)   Pulse 72   Temp 98.2 F (36.8 C) (Oral)  Resp 16   Ht 5\' 3"  (1.6 m)   Wt 131 lb 3.2 oz (59.5 kg)   LMP 01/23/2017 (Exact Date)   BMI 23.24 kg/m    Vitals:   02/16/17 1518  BP: 110/70  Pulse: 72  Resp: 16  Temp: 98.2 F (36.8 C)  TempSrc: Oral  Weight: 131 lb 3.2 oz (59.5 kg)  Height: 5\' 3"  (1.6 m)     Physical Exam  Constitutional: She is oriented to person, place, and time. She appears well-developed and well-nourished. No distress.  HENT:  Head: Normocephalic and atraumatic.  Right Ear: Hearing, tympanic membrane, external ear and ear canal normal.  Left Ear: Hearing, tympanic membrane, external ear and ear canal normal.  Nose: Nose normal.  Mouth/Throat: Uvula is midline, oropharynx is clear and moist and mucous membranes are normal. No  oropharyngeal exudate.  Eyes: Pupils are equal, round, and reactive to light. Conjunctivae and EOM are normal. Right eye exhibits no discharge. Left eye exhibits no discharge. No scleral icterus.  Neck: Normal range of motion. Neck supple. No JVD present. No tracheal deviation present. No thyromegaly present.  Cardiovascular: Normal rate, regular rhythm, normal heart sounds and intact distal pulses.  Exam reveals no gallop and no friction rub.   No murmur heard. Pulmonary/Chest: Effort normal and breath sounds normal. No respiratory distress. She has no decreased breath sounds. She has no wheezes. She has no rales. She exhibits no tenderness. Right breast exhibits no inverted nipple, no mass, no nipple discharge, no skin change and no tenderness. Left breast exhibits no inverted nipple, no mass, no nipple discharge, no skin change and no tenderness. Breasts are symmetrical.  Abdominal: Soft. Bowel sounds are normal. She exhibits no distension and no mass. There is no tenderness. There is no rebound and no guarding.  Musculoskeletal: Normal range of motion. She exhibits no edema or tenderness.  Lymphadenopathy:    She has no cervical adenopathy.  Neurological: She is alert and oriented to person, place, and time. She has normal reflexes.  Skin: Skin is warm and dry. No rash noted. She is not diaphoretic.  Psychiatric: She has a normal mood and affect. Her behavior is normal. Judgment and thought content normal.  Vitals reviewed.    Depression Screen PHQ 2/9 Scores 02/16/2017 02/02/2017  PHQ - 2 Score 0 0  PHQ- 9 Score - 1      Assessment & Plan:     Routine Health Maintenance and Physical Exam  Exercise Activities and Dietary recommendations Goals    None      Immunization History  Administered Date(s) Administered  . Influenza, Seasonal, Injecte, Preservative Fre 02/25/2015    Health Maintenance  Topic Date Due  . HIV Screening  03/20/1992  . TETANUS/TDAP  03/20/1996  .  INFLUENZA VACCINE  01/05/2017  . PAP SMEAR  05/08/2019     Discussed health benefits of physical activity, and encouraged her to engage in regular exercise appropriate for her age and condition.    1. Annual physical exam Normal physical exam today. Will check labs as below and f/u pending lab results. If labs are stable and WNL she will not need to have these rechecked for one year at her next annual physical exam. She is to call the office in the meantime if she has any acute issue, questions or concerns. - CBC with Differential/Platelet - Comprehensive metabolic panel - TSH  2. Breast cancer screening Breast exam today was normal. There is no family history of breast cancer.  She does perform regular self breast exams. Mammogram was ordered as below. Information for [facility] was given to patient so she may schedule her mammogram at her convenience. - MM Digital Screening; Future  3. Encounter for lipid screening for cardiovascular disease Will check labs as below and f/u pending results. - Lipid panel  4. Encounter for screening for diabetes mellitus Will check labs as below and f/u pending results. - Hemoglobin A1c  5. Influenza vaccine needed Flu vaccine given today without complication. Patient sat upright for 15 minutes to check for adverse reaction before being released. - Flu Vaccine QUAD 36+ mos IM  6. Need for tetanus, diphtheria, and acellular pertussis (Tdap) vaccine Tdap Vaccine given to patient without complications. Patient sat for 15 minutes after administration and was tolerated well without adverse effects. - Tdap vaccine greater than or equal to 7yo IM  --------------------------------------------------------------------    Mar Daring, PA-C  [facility]

## 2017-02-16 NOTE — Patient Instructions (Signed)

## 2017-02-17 ENCOUNTER — Other Ambulatory Visit: Payer: Self-pay | Admitting: Physician Assistant

## 2017-02-17 DIAGNOSIS — Z131 Encounter for screening for diabetes mellitus: Secondary | ICD-10-CM | POA: Diagnosis not present

## 2017-02-17 DIAGNOSIS — Z136 Encounter for screening for cardiovascular disorders: Secondary | ICD-10-CM | POA: Diagnosis not present

## 2017-02-17 DIAGNOSIS — Z0000 Encounter for general adult medical examination without abnormal findings: Secondary | ICD-10-CM | POA: Diagnosis not present

## 2017-02-17 DIAGNOSIS — Z13220 Encounter for screening for lipoid disorders: Secondary | ICD-10-CM | POA: Diagnosis not present

## 2017-02-18 LAB — CBC WITH DIFFERENTIAL/PLATELET
BASOS ABS: 0 10*3/uL (ref 0.0–0.2)
Basos: 1 %
EOS (ABSOLUTE): 0.2 10*3/uL (ref 0.0–0.4)
EOS: 3 %
HEMATOCRIT: 41.4 % (ref 34.0–46.6)
Hemoglobin: 14.1 g/dL (ref 11.1–15.9)
Immature Grans (Abs): 0 10*3/uL (ref 0.0–0.1)
Immature Granulocytes: 0 %
LYMPHS ABS: 1.8 10*3/uL (ref 0.7–3.1)
Lymphs: 23 %
MCH: 28.9 pg (ref 26.6–33.0)
MCHC: 34.1 g/dL (ref 31.5–35.7)
MCV: 85 fL (ref 79–97)
MONOS ABS: 0.6 10*3/uL (ref 0.1–0.9)
Monocytes: 8 %
Neutrophils Absolute: 5.2 10*3/uL (ref 1.4–7.0)
Neutrophils: 65 %
Platelets: 269 10*3/uL (ref 150–379)
RBC: 4.88 x10E6/uL (ref 3.77–5.28)
RDW: 13.5 % (ref 12.3–15.4)
WBC: 7.9 10*3/uL (ref 3.4–10.8)

## 2017-02-18 LAB — COMPREHENSIVE METABOLIC PANEL
ALK PHOS: 46 IU/L (ref 39–117)
ALT: 15 IU/L (ref 0–32)
AST: 20 IU/L (ref 0–40)
Albumin/Globulin Ratio: 1.8 (ref 1.2–2.2)
Albumin: 4.8 g/dL (ref 3.5–5.5)
BUN/Creatinine Ratio: 15 (ref 9–23)
BUN: 12 mg/dL (ref 6–20)
Bilirubin Total: 0.6 mg/dL (ref 0.0–1.2)
CO2: 22 mmol/L (ref 20–29)
CREATININE: 0.82 mg/dL (ref 0.57–1.00)
Calcium: 9.8 mg/dL (ref 8.7–10.2)
Chloride: 101 mmol/L (ref 96–106)
GFR calc Af Amer: 104 mL/min/{1.73_m2} (ref >59)
GFR calc non Af Amer: 90 mL/min/{1.73_m2} (ref >59)
GLOBULIN, TOTAL: 2.7 g/dL (ref 1.5–4.5)
Glucose: 99 mg/dL (ref 65–99)
POTASSIUM: 4.5 mmol/L (ref 3.5–5.2)
SODIUM: 139 mmol/L (ref 134–144)
Total Protein: 7.5 g/dL (ref 6.0–8.5)

## 2017-02-18 LAB — TSH: TSH: 1.64 u[IU]/mL (ref 0.450–4.500)

## 2017-02-18 LAB — LIPID PANEL W/O CHOL/HDL RATIO
CHOLESTEROL TOTAL: 170 mg/dL (ref 100–199)
HDL: 52 mg/dL (ref >39)
LDL CALC: 102 mg/dL — AB (ref 0–99)
TRIGLYCERIDES: 80 mg/dL (ref 0–149)
VLDL Cholesterol Cal: 16 mg/dL (ref 5–40)

## 2017-02-18 LAB — HGB A1C W/O EAG: Hgb A1c MFr Bld: 5.4 % (ref 4.8–5.6)

## 2017-02-23 ENCOUNTER — Telehealth: Payer: Self-pay

## 2017-02-23 NOTE — Telephone Encounter (Signed)
Patient advised as below.  

## 2017-02-23 NOTE — Telephone Encounter (Signed)
-----   Message from Mar Daring, [place] sent at 02/23/2017  9:26 AM EDT ----- Cholesterol has improved since last year and all other labs are WNL and stable.

## 2017-03-08 ENCOUNTER — Encounter: Payer: Self-pay | Admitting: Physician Assistant

## 2017-06-01 ENCOUNTER — Encounter: Payer: Self-pay | Admitting: Physician Assistant

## 2017-06-28 ENCOUNTER — Ambulatory Visit (INDEPENDENT_AMBULATORY_CARE_PROVIDER_SITE_OTHER): Payer: No Typology Code available for payment source | Admitting: Family Medicine

## 2017-06-28 ENCOUNTER — Encounter: Payer: Self-pay | Admitting: Family Medicine

## 2017-06-28 VITALS — BP 100/78 | HR 84 | Temp 98.4°F | Resp 16 | Wt 130.0 lb

## 2017-06-28 DIAGNOSIS — N920 Excessive and frequent menstruation with regular cycle: Secondary | ICD-10-CM | POA: Diagnosis not present

## 2017-06-28 DIAGNOSIS — Z30433 Encounter for removal and reinsertion of intrauterine contraceptive device: Secondary | ICD-10-CM

## 2017-06-28 LAB — POCT URINE PREGNANCY: Preg Test, Ur: NEGATIVE

## 2017-06-28 MED ORDER — LEVONORGESTREL 20 MCG/24HR IU IUD
INTRAUTERINE_SYSTEM | Freq: Once | INTRAUTERINE | Status: AC
  Administered 2017-06-28: 11:00:00 via INTRAUTERINE

## 2017-06-28 NOTE — Progress Notes (Signed)
Patient: Erica Fowler Female    DOB: 03/23/1976   42 y.o.   MRN: [id number] Visit Date: 06/28/2017  Today's Provider: Lavon Paganini, MD   I, Martha Clan, CMA, am acting as scribe for Lavon Paganini, MD.  Chief Complaint  Patient presents with  . Contraception   Subjective:    HPI    Patient has had Mirena IUD for menorrhagia x5 years.  She is due to have it replaced and would like to do so today.  States husband has had vasectomy, so she is not concerned about birth control as much as help with menorrhagia.  Before IUD was placed, she had heavy bleeding for 15 days at a time q30 days.  She had no bleeding for first 3-4 years of Mirena use.  Now having spotting intermittently.  She is sexually active with 1 female partner.  Pap smear UTD.  Has had 1 baby via C-section.    Allergies  Allergen Reactions  . Sulfa Antibiotics      Current Outpatient Medications:  .  acetaminophen (TYLENOL) 500 MG tablet, Take 500 mg by mouth every 6 (six) hours as needed., Disp: , Rfl:  .  Cyanocobalamin (VITAMIN B 12 PO), Take by mouth., Disp: , Rfl:  .  loratadine (CLARITIN) 10 MG tablet, Take 10 mg by mouth daily., Disp: , Rfl:  .  Multiple Vitamin (MULTIVITAMIN) tablet, Take 1 tablet by mouth daily., Disp: , Rfl:  .  Pseudoephedrine HCl (SUDAFED PO), Take by mouth., Disp: , Rfl:   Current Facility-Administered Medications:  .  levonorgestrel (MIRENA) 20 MCG/24HR IUD, , Intrauterine, Once, Jacorion Klem, Dionne Bucy, MD  Review of Systems  Constitutional: Negative for activity change, appetite change, chills, diaphoresis, fatigue, fever and unexpected weight change.  Genitourinary: Negative for dysuria, vaginal bleeding, vaginal discharge and vaginal pain.    Social History   Tobacco Use  . Smoking status: Former Smoker    Packs/day: 0.50    Years: 20.00    Pack years: 10.00    Types: Cigarettes    Last attempt to quit: 01/05/2016    Years since quitting: 1.4  .  Smokeless tobacco: Never Used  Substance Use Topics  . Alcohol use: Yes    Comment: occasional   Objective:   BP 100/78 (BP Location: Left Arm, Patient Position: Sitting, Cuff Size: Normal)   Pulse 84   Temp 98.4 F (36.9 C) (Oral)   Resp 16   Wt 130 lb (59 kg)   SpO2 98%   BMI 23.03 kg/m  Vitals:   06/28/17 0811  BP: 100/78  Pulse: 84  Resp: 16  Temp: 98.4 F (36.9 C)  TempSrc: Oral  SpO2: 98%  Weight: 130 lb (59 kg)     Physical Exam  Constitutional: She is oriented to person, place, and time. She appears well-developed and well-nourished. No distress.  Cardiovascular: Normal rate and regular rhythm.  Pulmonary/Chest: Effort normal. No respiratory distress.  Abdominal: Soft. There is no tenderness.  Genitourinary:  Genitourinary Comments: GYN:  External genitalia within normal limits.  Vaginal mucosa pink, moist, normal rugae.  Nonfriable cervix without lesions, no discharge, + bleeding noted on speculum exam.  IUD strings visible.    Neurological: She is alert and oriented to person, place, and time.  Psychiatric: She has a normal mood and affect. Her behavior is normal.  Vitals reviewed.   Results for orders placed or performed in visit on 06/28/17  POCT urine pregnancy  Result Value  Ref Range   Preg Test, Ur Negative Negative       Assessment & Plan:     1. Encounter for removal and reinsertion of intrauterine contraceptive device (IUD) 2. Menorrhagia with regular cycle - POCT urine pregnancy - negative - discussed that most women have decreased bleeding on Mirena - discussed efficacy as contraception - patient wishes to proceed with procedure today - see procedure note for IUD removal and placement    Meds ordered this encounter  Medications  . levonorgestrel (MIRENA) 20 MCG/24HR IUD    Return in about 4 weeks (around 07/26/2017) for string check.   The entirety of the information documented in the History of Present Illness, Review of Systems  and Physical Exam were personally obtained by me. Portions of this information were initially documented by Raquel Sarna Ratchford, CMA and reviewed by me for thoroughness and accuracy.    Virginia Crews, MD, MPH [facility] 06/28/2017 10:41 AM

## 2017-06-28 NOTE — Patient Instructions (Signed)

## 2017-06-28 NOTE — Progress Notes (Signed)
IUD Removal and Re-Insertion Procedure Note  Pre-operative Diagnosis: menorrhagia, desire for contraception  Post-operative Diagnosis: normal  Indications: contraception  Procedure Details  Urine pregnancy test was done and result was negative.  The risks (including infection, bleeding, pain, and uterine perforation) and benefits of the procedure were explained to the patient and Written informed consent was obtained.    Cervix and strings visualized.  Strings grasped and IUD easily removed.  Cervix cleansed with Betadine.Tenaculum applied to anterior cervix. Uterus sounded to 8 cm. IUD inserted without difficulty. String visible and trimmed. Patient tolerated procedure well.  IUD Information: Mirena, Lot # [id number], Expiration date 10/2019.  Condition: Stable  Complications: None  Plan:  The patient was advised to call for any fever or for prolonged or severe pain or bleeding. She was advised to use OTC analgesics as needed for mild to moderate pain.    Virginia Crews, MD, MPH [facility] 06/28/2017 9:06 AM

## 2017-07-29 ENCOUNTER — Encounter: Payer: Self-pay | Admitting: Family Medicine

## 2017-07-29 ENCOUNTER — Ambulatory Visit (INDEPENDENT_AMBULATORY_CARE_PROVIDER_SITE_OTHER): Payer: No Typology Code available for payment source | Admitting: Family Medicine

## 2017-07-29 VITALS — BP 90/62 | HR 59 | Temp 97.9°F | Resp 16

## 2017-07-29 DIAGNOSIS — N760 Acute vaginitis: Secondary | ICD-10-CM

## 2017-07-29 DIAGNOSIS — Z30431 Encounter for routine checking of intrauterine contraceptive device: Secondary | ICD-10-CM

## 2017-07-29 MED ORDER — METRONIDAZOLE 500 MG PO TABS: 500.0000 mg | ORAL_TABLET | Freq: Two times a day (BID) | ORAL | 0 refills | Status: DC

## 2017-07-29 NOTE — Patient Instructions (Signed)
Bacterial Vaginosis Bacterial vaginosis is a vaginal infection that occurs when the normal balance of bacteria in the vagina is disrupted. It results from an overgrowth of certain bacteria. This is the most common vaginal infection among women ages 15-44. Because bacterial vaginosis increases your risk for STIs (sexually transmitted infections), getting treated can help reduce your risk for chlamydia, gonorrhea, herpes, and HIV (human immunodeficiency virus). Treatment is also important for preventing complications in pregnant women, because this condition can cause an early (premature) delivery. What are the causes? This condition is caused by an increase in harmful bacteria that are normally present in small amounts in the vagina. However, the reason that the condition develops is not fully understood. What increases the risk? The following factors may make you more likely to develop this condition:  Having a new sexual partner or multiple sexual partners.  Having unprotected sex.  Douching.  Having an intrauterine device (IUD).  Smoking.  Drug and alcohol abuse.  Taking certain antibiotic medicines.  Being pregnant.  You cannot get bacterial vaginosis from toilet seats, bedding, swimming pools, or contact with objects around you. What are the signs or symptoms? Symptoms of this condition include:  Grey or white vaginal discharge. The discharge can also be watery or foamy.  A fish-like odor with discharge, especially after sexual intercourse or during menstruation.  Itching in and around the vagina.  Burning or pain with urination.  Some women with bacterial vaginosis have no signs or symptoms. How is this diagnosed? This condition is diagnosed based on:  Your medical history.  A physical exam of the vagina.  Testing a sample of vaginal fluid under a microscope to look for a large amount of bad bacteria or abnormal cells. Your health care provider may use a cotton swab  or a small wooden spatula to collect the sample.  How is this treated? This condition is treated with antibiotics. These may be given as a pill, a vaginal cream, or a medicine that is put into the vagina (suppository). If the condition comes back after treatment, a second round of antibiotics may be needed. Follow these instructions at home: Medicines  Take over-the-counter and prescription medicines only as told by your health care provider.  Take or use your antibiotic as told by your health care provider. Do not stop taking or using the antibiotic even if you start to feel better. General instructions  If you have a female sexual partner, tell her that you have a vaginal infection. She should see her health care provider and be treated if she has symptoms. If you have a female sexual partner, he does not need treatment.  During treatment: ? Avoid sexual activity until you finish treatment. ? Do not douche. ? Avoid alcohol as directed by your health care provider. ? Avoid breastfeeding as directed by your health care provider.  Drink enough water and fluids to keep your urine clear or pale yellow.  Keep the area around your vagina and rectum clean. ? Wash the area daily with warm water. ? Wipe yourself from front to back after using the toilet.  Keep all follow-up visits as told by your health care provider. This is important. How is this prevented?  Do not douche.  Wash the outside of your vagina with warm water only.  Use protection when having sex. This includes latex condoms and dental dams.  Limit how many sexual partners you have. To help prevent bacterial vaginosis, it is best to have sex with just   one partner (monogamous).  Make sure you and your sexual partner are tested for STIs.  Wear cotton or cotton-lined underwear.  Avoid wearing tight pants and pantyhose, especially during summer.  Limit the amount of alcohol that you drink.  Do not use any products that  contain nicotine or tobacco, such as cigarettes and e-cigarettes. If you need help quitting, ask your health care provider.  Do not use illegal drugs. Where to find more information:  Centers for Disease Control and Prevention: www.cdc.gov/std  American Sexual Health Association (ASHA): www.ashastd.org  [place] Department of Health and Human Services, Office on Women's Health: www.womenshealth.gov/ or https://www.womenshealth.gov/a-z-topics/bacterial-vaginosis Contact a health care provider if:  Your symptoms do not improve, even after treatment.  You have more discharge or pain when urinating.  You have a fever.  You have pain in your abdomen.  You have pain during sex.  You have vaginal bleeding between periods. Summary  Bacterial vaginosis is a vaginal infection that occurs when the normal balance of bacteria in the vagina is disrupted.  Because bacterial vaginosis increases your risk for STIs (sexually transmitted infections), getting treated can help reduce your risk for chlamydia, gonorrhea, herpes, and HIV (human immunodeficiency virus). Treatment is also important for preventing complications in pregnant women, because the condition can cause an early (premature) delivery.  This condition is treated with antibiotic medicines. These may be given as a pill, a vaginal cream, or a medicine that is put into the vagina (suppository). This information is not intended to replace advice given to you by your health care provider. Make sure you discuss any questions you have with your health care provider. Document Released: 05/24/2005 Document Revised: 09/27/2016 Document Reviewed: 02/07/2016 Elsevier Interactive Patient Education  2018 [employer]  

## 2017-07-29 NOTE — Progress Notes (Signed)
Patient: Erica Fowler Female    DOB: 05-12-1976   42 y.o.   MRN: [id number] Visit Date: 07/29/2017  Today's Provider: Lavon Paganini, MD   I, Martha Clan, CMA, am acting as scribe for Lavon Paganini, MD.  Chief Complaint  Patient presents with  . IUD String Check   Subjective:    HPI    Patient is here to follow up after getting Mirena IUD inserted. Patient states she had some post-coital bleeding/spotting, but was due for her menses at that time. She has noticed some spotting over the last 2 days. She is concerned about possible BV. She has noticed discharge with an abnormal smell. She has had BV in the past, and this is similar.   She has taken boric acid capsules in the past to prevent it but has been out of it for several months.  Has been treated with Boric acid suppositories, Flagyl, and Metrogel in the past.   Allergies  Allergen Reactions  . Sulfa Antibiotics      Current Outpatient Medications:  .  acetaminophen (TYLENOL) 500 MG tablet, Take 500 mg by mouth every 6 (six) hours as needed., Disp: , Rfl:  .  Cyanocobalamin (VITAMIN B 12 PO), Take by mouth., Disp: , Rfl:  .  loratadine (CLARITIN) 10 MG tablet, Take 10 mg by mouth daily., Disp: , Rfl:  .  Multiple Vitamin (MULTIVITAMIN) tablet, Take 1 tablet by mouth daily., Disp: , Rfl:  .  Pseudoephedrine HCl (SUDAFED PO), Take by mouth., Disp: , Rfl:   Review of Systems  Constitutional: Negative.   Respiratory: Negative.   Cardiovascular: Negative.   Gastrointestinal: Negative.   Genitourinary: Positive for vaginal bleeding and vaginal discharge. Negative for decreased urine volume, difficulty urinating, dyspareunia, dysuria, enuresis, flank pain, frequency, genital sores, hematuria, menstrual problem, pelvic pain, urgency and vaginal pain.  Neurological: Negative.     Social History   Tobacco Use  . Smoking status: Former Smoker    Packs/day: 0.50    Years: 20.00    Pack years: 10.00   Types: Cigarettes    Last attempt to quit: 01/05/2016    Years since quitting: 1.5  . Smokeless tobacco: Never Used  Substance Use Topics  . Alcohol use: Yes    Comment: occasional   Objective:   BP 90/62 (BP Location: Left Arm, Patient Position: Sitting, Cuff Size: Normal)   Pulse (!) 59   Temp 97.9 F (36.6 C) (Oral)   Resp 16   SpO2 99%  Vitals:   07/29/17 1333  BP: 90/62  Pulse: (!) 59  Resp: 16  Temp: 97.9 F (36.6 C)  TempSrc: Oral  SpO2: 99%     Physical Exam  Constitutional: She is oriented to person, place, and time. She appears well-developed and well-nourished. No distress.  HENT:  Head: Normocephalic and atraumatic.  Cardiovascular: Normal rate and regular rhythm.  Pulmonary/Chest: Effort normal. No respiratory distress.  Abdominal: Soft. She exhibits no distension. There is no tenderness.  Genitourinary:  Genitourinary Comments: GYN:  External genitalia within normal limits.  Vaginal mucosa pink, moist, normal rugae.  Nonfriable cervix without lesions, no bleeding, +malodorous discharge noted on speculum exam.  IUD strings visualized from cervix   Musculoskeletal: She exhibits no edema.  Neurological: She is alert and oriented to person, place, and time.  Psychiatric: She has a normal mood and affect. Her behavior is normal.  Vitals reviewed.       Assessment & Plan:  1. Encounter for routine checking of intrauterine contraceptive device (IUD) - strings visualized, no problems - return precautions discussed  2. Acute vaginitis - appears c/w BV - discussed lifestyle changes that can help prevent recurrent BV - will treat with flagyl - NuSwab for confirmation - pending results, plan to treat with Boric acid supposititory daily x30 days after finishing Flagyl - NuSwab BV and Candida, NAA    Meds ordered this encounter  Medications  . metroNIDAZOLE (FLAGYL) 500 MG tablet    Sig: Take 1 tablet (500 mg total) by mouth 2 (two) times daily.     Dispense:  14 tablet    Refill:  0     Return if symptoms worsen or fail to improve.   The entirety of the information documented in the History of Present Illness, Review of Systems and Physical Exam were personally obtained by me. Portions of this information were initially documented by Raquel Sarna Ratchford, CMA and reviewed by me for thoroughness and accuracy.    Virginia Crews, MD, MPH [facility] 07/29/2017 2:00 PM

## 2017-08-02 ENCOUNTER — Telehealth: Payer: Self-pay

## 2017-08-02 LAB — NUSWAB BV AND CANDIDA, NAA
ATOPOBIUM VAGINAE: HIGH {score} — AB
BVAB 2: HIGH Score — AB
CANDIDA GLABRATA, NAA: NEGATIVE
Candida albicans, NAA: NEGATIVE
Megasphaera 1: HIGH Score — AB

## 2017-08-02 NOTE — Telephone Encounter (Signed)
-----   Message from Virginia Crews, MD sent at 08/02/2017  8:15 AM EST ----- Test confirms BV which we are already treating. Continue Flagyl  Brita Romp Dionne Bucy, MD, MPH [facility] 08/02/2017 8:15 AM

## 2017-08-02 NOTE — Telephone Encounter (Signed)
Tried calling pt. No VM set up. Will try again later.

## 2017-08-02 NOTE — Telephone Encounter (Signed)
-----   Message from Virginia Crews, MD sent at 08/02/2017  8:15 AM EST ----- Test confirms BV which we are already treating. Continue Flagyl  Brita Romp Dionne Bucy, MD, MPH [facility] 08/02/2017 8:15 AM

## 2017-08-03 NOTE — Telephone Encounter (Signed)
Patient advised as below. Patient verbalizes understanding and is in agreement with treatment plan.  

## 2017-12-26 ENCOUNTER — Other Ambulatory Visit: Payer: Self-pay | Admitting: Physician Assistant

## 2017-12-26 DIAGNOSIS — Z1231 Encounter for screening mammogram for malignant neoplasm of breast: Secondary | ICD-10-CM

## 2018-01-16 ENCOUNTER — Ambulatory Visit
Admission: RE | Admit: 2018-01-16 | Discharge: 2018-01-16 | Disposition: A | Discharge status: Home / Self Care | Payer: No Typology Code available for payment source | Source: Ambulatory Visit | Attending: Physician Assistant | Admitting: Physician Assistant

## 2018-01-16 DIAGNOSIS — Z1231 Encounter for screening mammogram for malignant neoplasm of breast: Secondary | ICD-10-CM | POA: Diagnosis not present

## 2018-01-17 ENCOUNTER — Telehealth: Payer: Self-pay

## 2018-01-17 NOTE — Telephone Encounter (Signed)
-----   Message from Mar Daring, [place] sent at 01/17/2018 12:52 PM EDT ----- Normal mammogram. Repeat screening in one year.

## 2018-01-17 NOTE — Telephone Encounter (Signed)
Viewed by Celene Skeen on 01/17/2018 1:32 PM

## 2018-02-07 ENCOUNTER — Encounter: Payer: Self-pay | Admitting: Physician Assistant

## 2018-02-10 ENCOUNTER — Encounter: Payer: Self-pay | Admitting: Family Medicine

## 2018-02-10 ENCOUNTER — Ambulatory Visit (INDEPENDENT_AMBULATORY_CARE_PROVIDER_SITE_OTHER): Payer: No Typology Code available for payment source | Admitting: Family Medicine

## 2018-02-10 VITALS — BP 100/60 | HR 56 | Temp 98.2°F | Resp 15 | Wt 132.2 lb

## 2018-02-10 DIAGNOSIS — R202 Paresthesia of skin: Secondary | ICD-10-CM

## 2018-02-10 MED ORDER — VALACYCLOVIR HCL 1 G PO TABS: 1000.0000 mg | ORAL_TABLET | Freq: Three times a day (TID) | ORAL | 0 refills | Status: DC

## 2018-02-10 NOTE — Patient Instructions (Signed)
Let us know if new symptoms or not improving. 

## 2018-02-10 NOTE — Progress Notes (Signed)
  Subjective:     Patient ID: Erica Fowler, female   DOB: Apr 21, 1976, 42 y.o.   MRN: [id number] Chief Complaint  Patient presents with  . Skin Problem    Patient comes in office today with concerns of irritation of sin. Patient states that on Saturday she noticed a tinglingh sensation on her skin around her bra line that radiated across left breast. Patient denies any rash or burning/itching of the skin.    HPI States she has had two doses of the varicella vaccine. Denies tight fitting clothing or injury. Symptoms follow a dermatomal pattern.  Review of Systems     Objective:   Physical Exam  Constitutional: She appears well-developed and well-nourished. No distress.  Skin:  Localizes tingling from left midback to left sternum. No rash appreciated.       Assessment:    1. Paresthesias; ? Early shingles ? Sub-clinical due to her hx of vaccination. - valACYclovir (VALTREX) 1000 MG tablet; Take 1 tablet (1,000 mg total) by mouth 3 (three) times daily.  Dispense: 21 tablet; Refill: 0    Plan:    Further f/u as needed for new symptoms or not improving.

## 2018-02-27 ENCOUNTER — Encounter: Payer: Self-pay | Admitting: Physician Assistant

## 2018-02-27 DIAGNOSIS — R202 Paresthesia of skin: Secondary | ICD-10-CM

## 2018-02-28 MED ORDER — VALACYCLOVIR HCL 1 G PO TABS: 1000.0000 mg | ORAL_TABLET | Freq: Three times a day (TID) | ORAL | 0 refills | Status: DC

## 2018-02-28 NOTE — Addendum Note (Signed)
Addended by: Mar Daring on: 02/28/2018 08:44 AM   Modules accepted: Orders

## 2018-03-02 ENCOUNTER — Encounter: Payer: Self-pay | Admitting: Physician Assistant

## 2018-03-02 ENCOUNTER — Ambulatory Visit (INDEPENDENT_AMBULATORY_CARE_PROVIDER_SITE_OTHER): Payer: No Typology Code available for payment source | Admitting: Physician Assistant

## 2018-03-02 VITALS — BP 110/70 | HR 90 | Temp 99.0°F | Resp 16 | Wt 131.6 lb

## 2018-03-02 DIAGNOSIS — Z23 Encounter for immunization: Secondary | ICD-10-CM | POA: Diagnosis not present

## 2018-03-02 DIAGNOSIS — R202 Paresthesia of skin: Secondary | ICD-10-CM | POA: Diagnosis not present

## 2018-03-02 NOTE — Progress Notes (Signed)
Patient: Erica Fowler Female    DOB: 1976/02/10   42 y.o.   MRN: [id number] Visit Date: 03/02/2018  Today's Provider: Mar Daring, PA-C   Chief Complaint  Patient presents with  . Follow-up   Subjective:    HPI Patient here today to recheck shingles. Patient reports that it has been 4 weeks that she has been having this numbness tingling sensation, left side of her back radiating to the left side of her abdomen, but so far no rash. She continues to have the sensation but reports it is lessening with the Valtrex. She reports in the beginning she could not even wear her bra but now she can. Going to see her sister this weekend with a newborn and wants to make sure she can see her.     Allergies  Allergen Reactions  . Sulfa Antibiotics      Current Outpatient Medications:  .  acetaminophen (TYLENOL) 500 MG tablet, Take 500 mg by mouth every 6 (six) hours as needed., Disp: , Rfl:  .  Cyanocobalamin (VITAMIN B 12 PO), Take by mouth., Disp: , Rfl:  .  levonorgestrel (MIRENA) 20 MCG/24HR IUD, 1 each by Intrauterine route once., Disp: , Rfl:  .  loratadine (CLARITIN) 10 MG tablet, Take 10 mg by mouth daily., Disp: , Rfl:  .  Multiple Vitamin (MULTIVITAMIN) tablet, Take 1 tablet by mouth daily., Disp: , Rfl:  .  valACYclovir (VALTREX) 1000 MG tablet, Take 1 tablet (1,000 mg total) by mouth 3 (three) times daily., Disp: 21 tablet, Rfl: 0 .  Pseudoephedrine HCl (SUDAFED PO), Take by mouth., Disp: , Rfl:   Review of Systems  Constitutional: Negative.   Respiratory: Negative.   Cardiovascular: Negative.   Musculoskeletal: Negative.   Neurological: Negative.     Social History   Tobacco Use  . Smoking status: Former Smoker    Packs/day: 0.50    Years: 20.00    Pack years: 10.00    Types: Cigarettes    Last attempt to quit: 01/05/2016    Years since quitting: 2.1  . Smokeless tobacco: Never Used  Substance Use Topics  . Alcohol use: Yes    Comment: occasional     Objective:   BP 110/70 (BP Location: Left Arm, Patient Position: Sitting, Cuff Size: Normal)   Pulse 90   Temp 99 F (37.2 C) (Oral)   Resp 16   Wt 131 lb 9.6 oz (59.7 kg)   BMI 23.31 kg/m  Vitals:   03/02/18 0811  BP: 110/70  Pulse: 90  Resp: 16  Temp: 99 F (37.2 C)  TempSrc: Oral  Weight: 131 lb 9.6 oz (59.7 kg)     Physical Exam  Constitutional: She appears well-developed and well-nourished. No distress.  Neck: Normal range of motion. Neck supple.  Cardiovascular: Normal rate, regular rhythm and normal heart sounds. Exam reveals no gallop and no friction rub.  No murmur heard. Pulmonary/Chest: Effort normal and breath sounds normal. No respiratory distress. She has no wheezes. She has no rales.  Skin: She is not diaphoretic.     Vitals reviewed.       Assessment & Plan:     1. Paresthesia of skin Responding slowly to Valtrex even though no rash is visible. Advised to continue to monitor for symptom changes. Also advised as long as she does not have any blisters she is not contagious. Call if symptoms worsen or fail to improve.   2. Need for influenza  vaccination Flu vaccine given today without complication. Patient sat upright for 15 minutes to check for adverse reaction before being released. - Flu Vaccine QUAD 36+ mos IM       Mar Daring, PA-C  [facility]

## 2018-03-07 ENCOUNTER — Encounter: Payer: Self-pay | Admitting: Physician Assistant

## 2018-03-07 NOTE — Telephone Encounter (Signed)
Erica Fowler can we fax record of her flu vaccine please. Thanks.

## 2018-03-15 ENCOUNTER — Encounter: Payer: Self-pay | Admitting: Physician Assistant

## 2018-03-17 ENCOUNTER — Ambulatory Visit
Admission: RE | Admit: 2018-03-17 | Discharge: 2018-03-17 | Disposition: A | Discharge status: Home / Self Care | Payer: No Typology Code available for payment source | Source: Ambulatory Visit | Attending: Physician Assistant | Admitting: Physician Assistant

## 2018-03-17 ENCOUNTER — Encounter: Payer: Self-pay | Admitting: Physician Assistant

## 2018-03-17 ENCOUNTER — Ambulatory Visit (INDEPENDENT_AMBULATORY_CARE_PROVIDER_SITE_OTHER): Payer: No Typology Code available for payment source | Admitting: Physician Assistant

## 2018-03-17 VITALS — BP 102/70 | HR 81 | Temp 98.2°F | Resp 16 | Wt 132.2 lb

## 2018-03-17 DIAGNOSIS — R200 Anesthesia of skin: Secondary | ICD-10-CM

## 2018-03-17 DIAGNOSIS — R202 Paresthesia of skin: Secondary | ICD-10-CM

## 2018-03-17 DIAGNOSIS — Z832 Family history of diseases of the blood and blood-forming organs and certain disorders involving the immune mechanism: Secondary | ICD-10-CM

## 2018-03-17 NOTE — Progress Notes (Signed)
Patient: Erica Fowler Female    DOB: 30-May-1976   42 y.o.   MRN: [id number] Visit Date: 03/17/2018  Today's Provider: Mar Daring, PA-C   Chief Complaint  Patient presents with  . Follow-up   Subjective:    HPI Patient coming in today to follow-up shingles, she is already done with the medication. Patient reports she still having numbness, tingling sensation on her left side which includes the top of her left thigh. She also reports that she is noticing like the bottom of her bif toe on her left foot feels similar. She has been taking Aleve twice a day.  She reports the tingling sensation has been progressing. It started over the left rib cage, but then progressed downwards through the left side anterior abdomen into the left thigh, down medial side of left leg to the left big toe now. No pain. No history of injury. There is a very strong history of autoimmune disorders.     Allergies  Allergen Reactions  . Sulfa Antibiotics      Current Outpatient Medications:  .  acetaminophen (TYLENOL) 500 MG tablet, Take 500 mg by mouth every 6 (six) hours as needed., Disp: , Rfl:  .  Cyanocobalamin (VITAMIN B 12 PO), Take by mouth., Disp: , Rfl:  .  levonorgestrel (MIRENA) 20 MCG/24HR IUD, 1 each by Intrauterine route once., Disp: , Rfl:  .  loratadine (CLARITIN) 10 MG tablet, Take 10 mg by mouth daily., Disp: , Rfl:  .  Multiple Vitamin (MULTIVITAMIN) tablet, Take 1 tablet by mouth daily., Disp: , Rfl:  .  Pseudoephedrine HCl (SUDAFED PO), Take by mouth., Disp: , Rfl:   Review of Systems  Constitutional: Negative.   Respiratory: Negative.   Cardiovascular: Negative.   Gastrointestinal: Negative.   Musculoskeletal: Negative.   Neurological: Positive for numbness. Negative for weakness.    Social History   Tobacco Use  . Smoking status: Former Smoker    Packs/day: 0.50    Years: 20.00    Pack years: 10.00    Types: Cigarettes    Last attempt to quit:  01/05/2016    Years since quitting: 2.1  . Smokeless tobacco: Never Used  Substance Use Topics  . Alcohol use: Yes    Comment: occasional   Objective:   BP 102/70 (BP Location: Left Arm, Patient Position: Sitting, Cuff Size: Normal)   Pulse 81   Temp 98.2 F (36.8 C) (Oral)   Resp 16   Wt 132 lb 3.2 oz (60 kg)   SpO2 98%   BMI 23.42 kg/m  Vitals:   03/17/18 0850  BP: 102/70  Pulse: 81  Resp: 16  Temp: 98.2 F (36.8 C)  TempSrc: Oral  SpO2: 98%  Weight: 132 lb 3.2 oz (60 kg)     Physical Exam  Constitutional: She is oriented to person, place, and time. She appears well-developed and well-nourished. No distress.  Neck: Normal range of motion. Neck supple.  Cardiovascular: Normal rate, regular rhythm and normal heart sounds. Exam reveals no gallop and no friction rub.  No murmur heard. Pulmonary/Chest: Effort normal and breath sounds normal. No respiratory distress. She has no wheezes. She has no rales.  Musculoskeletal:       Thoracic back: Normal.       Lumbar back: Normal.  Neurological: She is alert and oriented to person, place, and time. She has normal strength. No cranial nerve deficit or sensory deficit. She displays a negative Romberg sign.  Coordination and gait normal.  Skin: She is not diaphoretic.  Vitals reviewed.      Assessment & Plan:     1. Numbness and tingling Worsening. Unsure of source. Will get imaging as below to see if any bony abnormality. Will check labs as below as well. I will f/u pending results.  - DG Thoracic Spine W/Swimmers; Future - DG Lumbar Spine Complete; Future - CBC w/Diff/Platelet - TSH - ANA,IFA RA Diag Pnl w/rflx Tit/Patn  2. Family history of autoimmune disorder See above medical treatment plan. - CBC w/Diff/Platelet - TSH - ANA,IFA RA Diag Pnl w/rflx Tit/Patn       Mar Daring, PA-C  [facility]

## 2018-03-17 NOTE — Patient Instructions (Signed)

## 2018-03-20 ENCOUNTER — Encounter: Payer: Self-pay | Admitting: Physician Assistant

## 2018-03-20 DIAGNOSIS — R202 Paresthesia of skin: Principal | ICD-10-CM

## 2018-03-20 DIAGNOSIS — R200 Anesthesia of skin: Secondary | ICD-10-CM

## 2018-03-20 LAB — TSH: TSH: 0.879 u[IU]/mL (ref 0.450–4.500)

## 2018-03-20 LAB — CBC WITH DIFFERENTIAL/PLATELET
BASOS: 1 %
Basophils Absolute: 0.1 10*3/uL (ref 0.0–0.2)
EOS (ABSOLUTE): 0.2 10*3/uL (ref 0.0–0.4)
EOS: 3 %
HEMATOCRIT: 43.6 % (ref 34.0–46.6)
HEMOGLOBIN: 14.5 g/dL (ref 11.1–15.9)
IMMATURE GRANS (ABS): 0 10*3/uL (ref 0.0–0.1)
Immature Granulocytes: 0 %
LYMPHS: 20 %
Lymphocytes Absolute: 1.8 10*3/uL (ref 0.7–3.1)
MCH: 29.7 pg (ref 26.6–33.0)
MCHC: 33.3 g/dL (ref 31.5–35.7)
MCV: 89 fL (ref 79–97)
Monocytes Absolute: 0.8 10*3/uL (ref 0.1–0.9)
Monocytes: 9 %
NEUTROS PCT: 67 %
Neutrophils Absolute: 6.2 10*3/uL (ref 1.4–7.0)
Platelets: 276 10*3/uL (ref 150–450)
RBC: 4.88 x10E6/uL (ref 3.77–5.28)
RDW: 13.1 % (ref 12.3–15.4)
WBC: 9.2 10*3/uL (ref 3.4–10.8)

## 2018-03-20 LAB — ANA,IFA RA DIAG PNL W/RFLX TIT/PATN
ANA Titer 1: NEGATIVE
CYCLIC CITRULLIN PEPTIDE AB: 9 U (ref 0–19)
Rhuematoid fact SerPl-aCnc: <10 IU/mL (ref 0.0–13.9)

## 2018-05-01 ENCOUNTER — Telehealth: Payer: No Typology Code available for payment source | Admitting: Physician Assistant

## 2018-05-01 DIAGNOSIS — J069 Acute upper respiratory infection, unspecified: Secondary | ICD-10-CM | POA: Diagnosis not present

## 2018-05-01 MED ORDER — BENZONATATE 100 MG PO CAPS: 200.0000 mg | ORAL_CAPSULE | Freq: Three times a day (TID) | ORAL | 0 refills | Status: DC | PRN

## 2018-05-01 MED ORDER — AZITHROMYCIN 250 MG PO TABS: ORAL_TABLET | ORAL | 0 refills | Status: DC

## 2018-05-01 MED ORDER — FLUTICASONE PROPIONATE 50 MCG/ACT NA SUSP: 2.0000 | Freq: Every day | NASAL | 0 refills | Status: DC

## 2018-05-01 NOTE — Progress Notes (Signed)
We are sorry that you are not feeling well.  Here is how we plan to help!  Based on your presentation I believe you most likely have A cough due to bacteria.  When patients have a fever and a productive cough with a change in color or increased sputum production, we are concerned about bacterial bronchitis.  If left untreated it can progress to pneumonia.  If your symptoms do not improve with your treatment plan it is important that you contact your provider.   I have prescribed Azithromyin 250 mg: two tablets now and then one tablet daily for 4 additonal days    In addition you may use A prescription cough medication called Tessalon Perles 100mg . You may take 1-2 capsules every 8 hours as needed for your cough.  I recommend taking daily Cetirizine 10 mg and  Flonase, a steroidal nasal spray that would help with any underlying allergies and with your "blocked sinuses".   From your responses in the eVisit questionnaire you describe inflammation in the upper respiratory tract which is causing a significant cough.  This is commonly called Bronchitis and has four common causes:    Allergies  Viral Infections  Acid Reflux  Bacterial Infection Allergies, viruses and acid reflux are treated by controlling symptoms or eliminating the cause. An example might be a cough caused by taking certain blood pressure medications. You stop the cough by changing the medication. Another example might be a cough caused by acid reflux. Controlling the reflux helps control the cough.  USE OF BRONCHODILATOR ("RESCUE") INHALERS: There is a risk from using your bronchodilator too frequently.  The risk is that over-reliance on a medication which only relaxes the muscles surrounding the breathing tubes can reduce the effectiveness of medications prescribed to reduce swelling and congestion of the tubes themselves.  Although you feel brief relief from the bronchodilator inhaler, your asthma may actually be worsening with the  tubes becoming more swollen and filled with mucus.  This can delay other crucial treatments, such as oral steroid medications. If you need to use a bronchodilator inhaler daily, several times per day, you should discuss this with your provider.  There are probably better treatments that could be used to keep your asthma under control.     HOME CARE . Only take medications as instructed by your medical team. . Complete the entire course of an antibiotic. . Drink plenty of fluids and get plenty of rest. . Avoid close contacts especially the very young and the elderly . Cover your mouth if you cough or cough into your sleeve. . Always remember to wash your hands . A steam or ultrasonic humidifier can help congestion.   GET HELP RIGHT AWAY IF: . You develop worsening fever. . You become short of breath . You cough up blood. . Your symptoms persist after you have completed your treatment plan MAKE SURE YOU   Understand these instructions.  Will watch your condition.  Will get help right away if you are not doing well or get worse.  Your e-visit answers were reviewed by a board certified advanced clinical practitioner to complete your personal care plan.  Depending on the condition, your plan could have included both over the counter or prescription medications. If there is a problem please reply  once you have received a response from your provider. Your safety is important to [place].  If you have drug allergies check your prescription carefully.    You can use MyChart to ask questions  about today's visit, request a non-urgent call back, or ask for a work or school excuse for 24 hours related to this e-Visit. If it has been greater than 24 hours you will need to follow up with your provider, or enter a new e-Visit to address those concerns. You will get an e-mail in the next two days asking about your experience.  I hope that your e-visit has been valuable and will speed your recovery. Thank you  for using e-visits.

## 2019-01-22 ENCOUNTER — Other Ambulatory Visit: Payer: Self-pay | Admitting: Physician Assistant

## 2019-01-22 DIAGNOSIS — Z1231 Encounter for screening mammogram for malignant neoplasm of breast: Secondary | ICD-10-CM

## 2019-01-25 ENCOUNTER — Other Ambulatory Visit: Payer: Self-pay

## 2019-01-25 ENCOUNTER — Ambulatory Visit
Admission: RE | Admit: 2019-01-25 | Discharge: 2019-01-25 | Disposition: A | Discharge status: Home / Self Care | Payer: No Typology Code available for payment source | Source: Ambulatory Visit | Attending: Physician Assistant | Admitting: Physician Assistant

## 2019-01-25 DIAGNOSIS — Z1231 Encounter for screening mammogram for malignant neoplasm of breast: Secondary | ICD-10-CM | POA: Diagnosis present

## 2019-03-21 ENCOUNTER — Telehealth: Payer: No Typology Code available for payment source | Admitting: Family

## 2019-03-21 ENCOUNTER — Other Ambulatory Visit: Payer: Self-pay | Admitting: *Deleted

## 2019-03-21 DIAGNOSIS — J069 Acute upper respiratory infection, unspecified: Secondary | ICD-10-CM | POA: Diagnosis not present

## 2019-03-21 DIAGNOSIS — Z20822 Contact with and (suspected) exposure to covid-19: Secondary | ICD-10-CM

## 2019-03-21 MED ORDER — FLUTICASONE PROPIONATE 50 MCG/ACT NA SUSP: 2.0000 | Freq: Every day | NASAL | 6 refills | Status: DC

## 2019-03-21 NOTE — Progress Notes (Signed)
E-Visit for Corona Virus Screening   Your current symptoms could be consistent with the coronavirus.  Many health care providers can now test patients at their office but not all are.  [place] has multiple testing sites. For information on our COVID testing locations and hours go to [email or web address]  Please quarantine yourself while awaiting your test results.    You can go to one of the  testing sites listed below, while they are opened (see hours). You do not need an order and will stay in your car during the test. You do need to self isolate until your results return and if positive 10 days from when your symptoms started and until you are 3 days symptom free.   Testing Locations (Monday - Friday, 8 a.m. - 3:30 p.m.) . [place]: [facility], [address] [place], [place]: [place], [place], [place], [place] (entrance off [address])  . [facility]: (Closed each Monday): Testing site relocated to the short stay covered drive at [facility]. (Use the [facility] entrance to [facility] next to [place] is a respiratory illness with symptoms that are similar to the flu. Symptoms are typically mild to moderate, but there have been cases of severe illness and death due to the virus. The following symptoms may appear 2-14 days after exposure: . Fever . Cough . Shortness of breath or difficulty breathing . Chills . Repeated shaking with chills . Muscle pain . Headache . Sore throat . New loss of taste or smell . Fatigue . Congestion or runny nose . Nausea or vomiting . Diarrhea  It is vitally important that if you feel that you have an infection such as this virus or any other virus that you stay home and away from places where you may spread it to others.  You should self-quarantine for 14 days if you have symptoms that could  potentially be coronavirus or have been in close contact a with a person diagnosed with COVID-19 within the last 2 weeks. You should avoid contact with people age 42 and older.   You should wear a mask or cloth face covering over your nose and mouth if you must be around other people or animals, including pets (even at home). Try to stay at least 6 feet away from other people. This will protect the people around you.  You can use medication such as flonase. You will use two sprays in each nostril daily.   You may also take acetaminophen (Tylenol) as needed for fever.   Reduce your risk of any infection by using the same precautions used for avoiding the common cold or flu:  Marland Kitchen Wash your hands often with soap and warm water for at least 20 seconds.  If soap and water are not readily available, use an alcohol-based hand sanitizer with at least 60% alcohol.  . If coughing or sneezing, cover your mouth and nose by coughing or sneezing into the elbow areas of your shirt or coat, into a tissue or into your sleeve (not your hands). . Avoid shaking hands with others and consider head nods or verbal greetings only. . Avoid touching your eyes, nose, or mouth with unwashed hands.  . Avoid close contact with people who are sick. . Avoid places or events with large numbers of people in one location, like concerts or sporting events. . Carefully consider travel plans you have  or are making. . If you are planning any travel outside or inside the [place], visit the CDC's Travelers' Health webpage for the latest health notices. . If you have some symptoms but not all symptoms, continue to monitor at home and seek medical attention if your symptoms worsen. . If you are having a medical emergency, call 911.  HOME CARE . Only take medications as instructed by your medical team. . Drink plenty of fluids and get plenty of rest. . A steam or ultrasonic humidifier can help if you have congestion.   GET HELP RIGHT AWAY  IF YOU HAVE EMERGENCY WARNING SIGNS** FOR COVID-19. If you or someone is showing any of these signs seek emergency medical care immediately. Call 911 or proceed to your closest emergency facility if: . You develop worsening high fever. . Trouble breathing . Bluish lips or face . Persistent pain or pressure in the chest . New confusion . Inability to wake or stay awake . You cough up blood. . Your symptoms become more severe  **This list is not all possible symptoms. Contact your medical provider for any symptoms that are sever or concerning to you.   MAKE SURE YOU   Understand these instructions.  Will watch your condition.  Will get help right away if you are not doing well or get worse.  Your e-visit answers were reviewed by a board certified advanced clinical practitioner to complete your personal care plan.  Depending on the condition, your plan could have included both over the counter or prescription medications.  If there is a problem please reply once you have received a response from your provider.  Your safety is important to [place].  If you have drug allergies check your prescription carefully.    You can use MyChart to ask questions about today's visit, request a non-urgent call back, or ask for a work or school excuse for 24 hours related to this e-Visit. If it has been greater than 24 hours you will need to follow up with your provider, or enter a new e-Visit to address those concerns. You will get an e-mail in the next two days asking about your experience.  I hope that your e-visit has been valuable and will speed your recovery. Thank you for using e-visits.   Approximately 5 minutes was spent documenting and reviewing patient's chart.

## 2019-03-23 LAB — NOVEL CORONAVIRUS, NAA: SARS-CoV-2, NAA: NOT DETECTED

## 2019-03-30 ENCOUNTER — Ambulatory Visit (INDEPENDENT_AMBULATORY_CARE_PROVIDER_SITE_OTHER): Payer: No Typology Code available for payment source | Admitting: Family Medicine

## 2019-03-30 ENCOUNTER — Encounter: Payer: Self-pay | Admitting: Family Medicine

## 2019-03-30 VITALS — Temp 99.1°F | Ht 63.0 in

## 2019-03-30 DIAGNOSIS — J039 Acute tonsillitis, unspecified: Secondary | ICD-10-CM

## 2019-03-30 MED ORDER — FLUCONAZOLE 150 MG PO TABS: 150.0000 mg | ORAL_TABLET | Freq: Once | ORAL | 0 refills | Status: AC

## 2019-03-30 MED ORDER — AMOXICILLIN-POT CLAVULANATE 875-125 MG PO TABS: 1.0000 | ORAL_TABLET | Freq: Two times a day (BID) | ORAL | 0 refills | Status: AC

## 2019-03-30 NOTE — Patient Instructions (Signed)
.   Please review the attached list of medications and notify my office if there are any errors.   . Please bring all of your medications to every appointment so we can make sure that our medication list is the same as yours.   . It is especially important to get the annual flu vaccine this year. If you haven't had it already, please go to your pharmacy or call the office as soon as possible to schedule you flu shot.  

## 2019-03-30 NOTE — Progress Notes (Signed)
Patient: Erica Fowler Female    DOB: [birth year]   43 y.o.   MRN: [id number] Visit Date: 03/30/2019  Today's Provider: Lelon Huh, MD   Chief Complaint  Patient presents with  . URI   Subjective:    Virtual Visit via Video Note  I connected with Erica Fowler on 03/30/19 at  9:40 AM EDT by a video enabled telemedicine application and verified that I am speaking with the correct person using two identifiers.  Location: Patient: home Provider: [facility]   I discussed the limitations of evaluation and management by telemedicine and the availability of in person appointments. The patient expressed understanding and agreed to proceed.   URI  This is a new problem. The current episode started yesterday. The problem has been gradually worsening. Maximum temperature: 99.5. The fever has been present for 1 to 2 days. Associated symptoms include ear pain and a sore throat. Associated symptoms comments: Fever . Treatments tried: Flonase, Mucinex D and Tylenol. The treatment provided no relief.  throat and ear pain are only on the left. No sinus pain or pressure. Some swelling in lymph nodes left side of neck. No cough, nausea, or vomiting. States that tonsil on left is red swollen and has black spots, unable to get video or picture of it.    Allergies  Allergen Reactions  . Sulfa Antibiotics      Current Outpatient Medications:  .  acetaminophen (TYLENOL) 500 MG tablet, Take 500 mg by mouth every 6 (six) hours as needed., Disp: , Rfl:  .  Cyanocobalamin (VITAMIN B 12 PO), Take by mouth., Disp: , Rfl:  .  fluticasone (FLONASE) 50 MCG/ACT nasal spray, Place 2 sprays into both nostrils daily., Disp: 16 g, Rfl: 6 .  levonorgestrel (MIRENA) 20 MCG/24HR IUD, 1 each by Intrauterine route once., Disp: , Rfl:  .  loratadine (CLARITIN) 10 MG tablet, Take 10 mg by mouth daily., Disp: , Rfl:  .  Multiple Vitamin (MULTIVITAMIN) tablet, Take 1 tablet by mouth daily., Disp: , Rfl:  .   Pseudoephedrine HCl (SUDAFED PO), Take by mouth., Disp: , Rfl:   Review of Systems  HENT: Positive for ear pain and sore throat.     Social History   Tobacco Use  . Smoking status: Former Smoker    Packs/day: 0.50    Years: 20.00    Pack years: 10.00    Types: Cigarettes    Quit date: 01/05/2016    Years since quitting: 3.2  . Smokeless tobacco: Never Used  Substance Use Topics  . Alcohol use: Yes    Comment: occasional      Objective:   Temp 99.1 F (37.3 C) (Oral)   Ht 5\' 3"  (1.6 m)   BMI 23.42 kg/m  Vitals:   03/30/19 0846  Temp: 99.1 F (37.3 C)  TempSrc: Oral  Height: 5\' 3"  (1.6 m)  Body mass index is 23.42 kg/m.   Physical Exam  General appearance: Well developed, well nourished female, cooperative and in no acute distress Head: Normocephalic, without obvious abnormality, atraumatic Respiratory: Respirations even and unlabored, normal respiratory rate       Assessment & Plan     1. Tonsillitis  - amoxicillin-clavulanate (AUGMENTIN) 875-125 MG tablet; Take 1 tablet by mouth 2 (two) times daily for 7 days.  Dispense: 14 tablet; Refill: 0 - fluconazole (DIFLUCAN) 150 MG tablet; Take 1 tablet (150 mg total) by mouth once for 1 dose.  Dispense: 1 tablet; Refill:  0  Counseled on warm salt water gargles, OTC tylenol and/or NSAIDs.  Call if symptoms change or if not rapidly improving.      The entirety of the information documented in the History of Present Illness, Review of Systems and Physical Exam were personally obtained by me. Portions of this information were initially documented by Idelle Jo, CMA and reviewed by me for thoroughness and accuracy.      I discussed the assessment and treatment plan with the patient. The patient was provided an opportunity to ask questions and all were answered. The patient agreed with the plan and demonstrated an understanding of the instructions.   The patient was advised to call back or seek an in-person  evaluation if the symptoms worsen or if the condition fails to improve as anticipated.  I provided 8 minutes of non-face-to-face time during this encounter.     Lelon Huh, MD  [facility]

## 2019-06-23 IMAGING — CR DG LUMBAR SPINE COMPLETE 4+V
1 series · 5 of 5 positions shown · non-contrast
Comparison: None.

CLINICAL DATA: Numbness and tingling in the left side of mid back
and left abdomen radiating into the left leg and foot for 6 weeks.
No known injury.

EXAM:
LUMBAR SPINE - COMPLETE 4+ VIEW

[Series 1: dg lumbar spine complete 4 +v · 0.14mm/px · 5 of 5 slices shown]
[im 1/5]
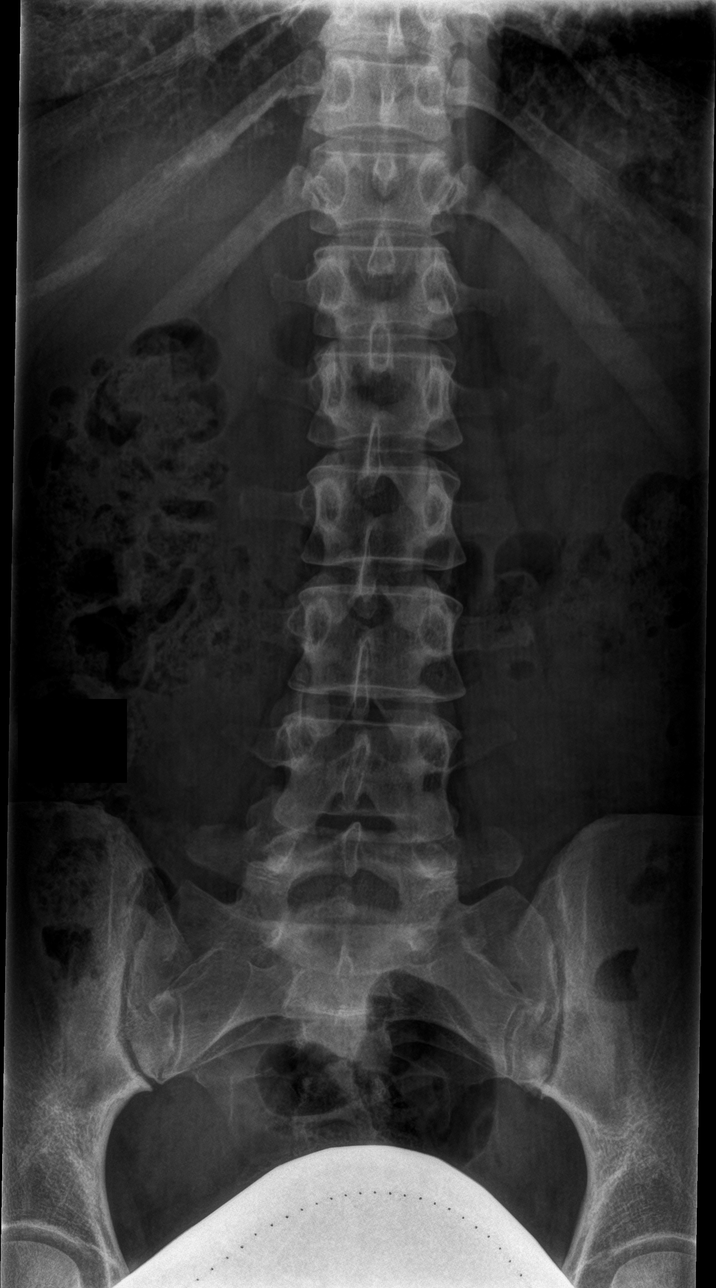
[im 2/5]
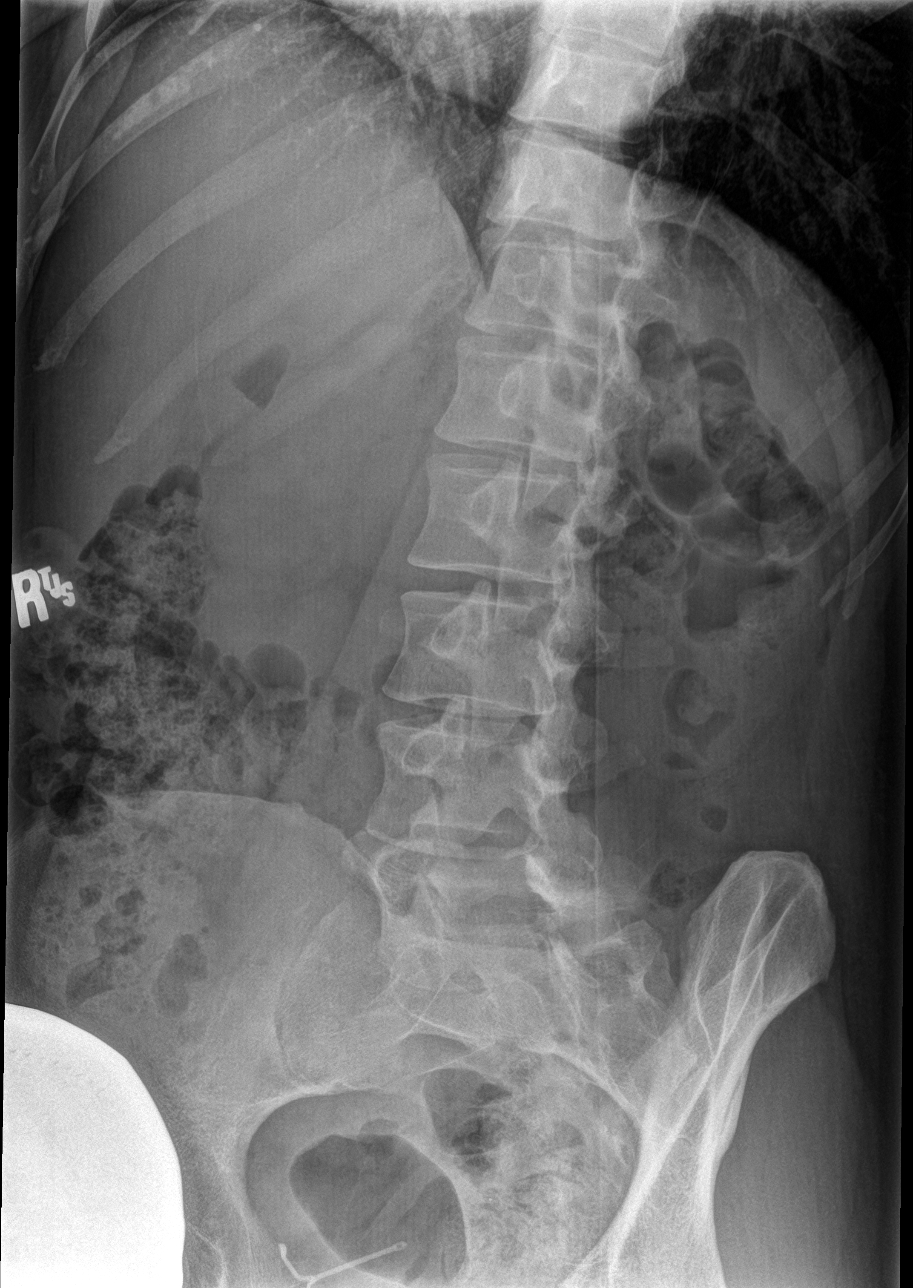
[im 3/5]
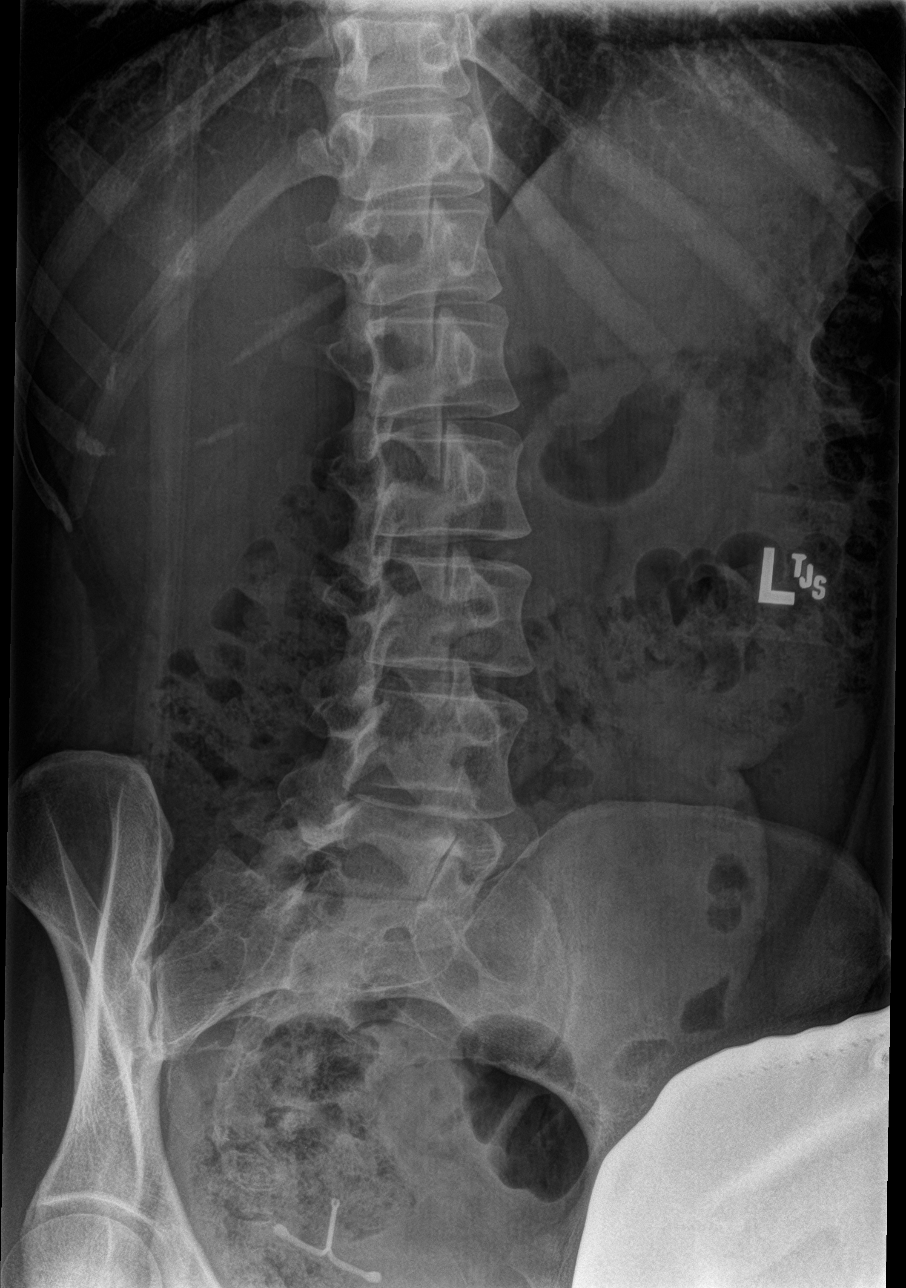
[im 4/5]
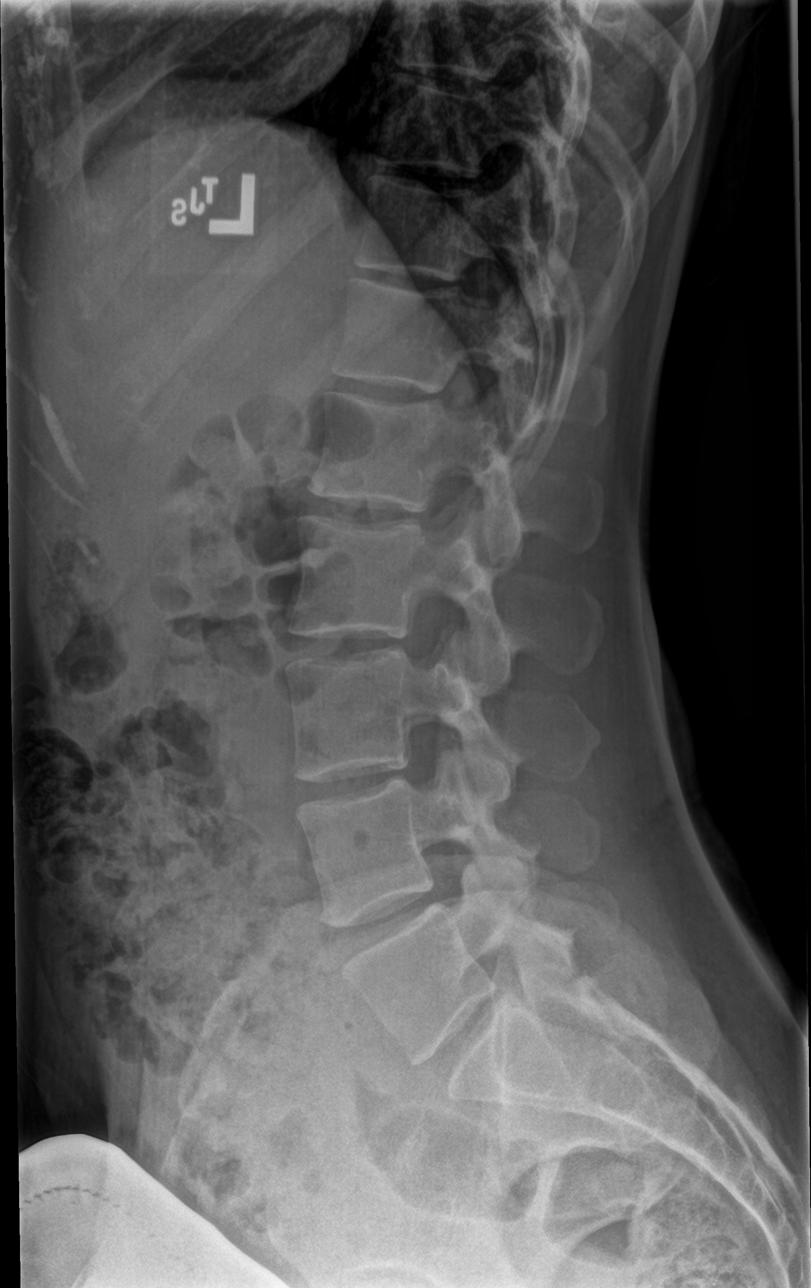
[im 5/5]
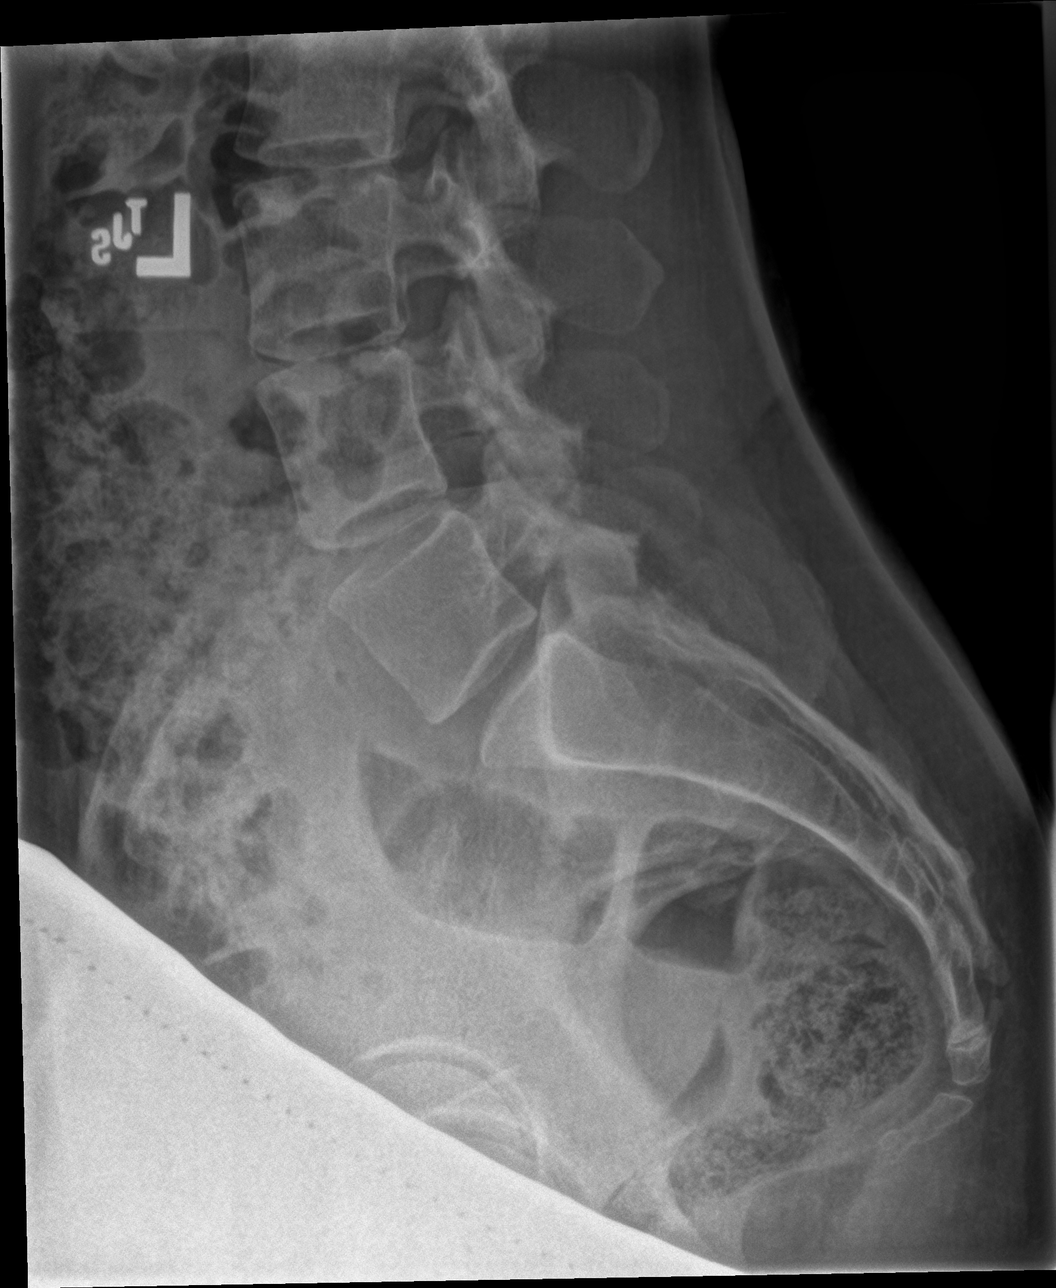

[5 of 5 positions shown; findings below may reference images not displayed]

FINDINGS: There is no evidence of lumbar spine fracture. Alignment is normal.
Intervertebral disc spaces are maintained.
IMPRESSION: Negative exam.

## 2019-06-23 IMAGING — CR DG THORACIC SPINE 3V
1 series · 3 of 3 positions shown · non-contrast
Comparison: None.

CLINICAL DATA: Numbness and tingling in the left side of mid back
and left abdomen radiating into the left leg and foot for 6 weeks. 9
known injury.

EXAM:
THORACIC SPINE - 3 VIEWS

[Series 1: dg thoracic spine w/swimmers · 0.14mm/px · 3 of 3 slices shown]
[im 1/3]
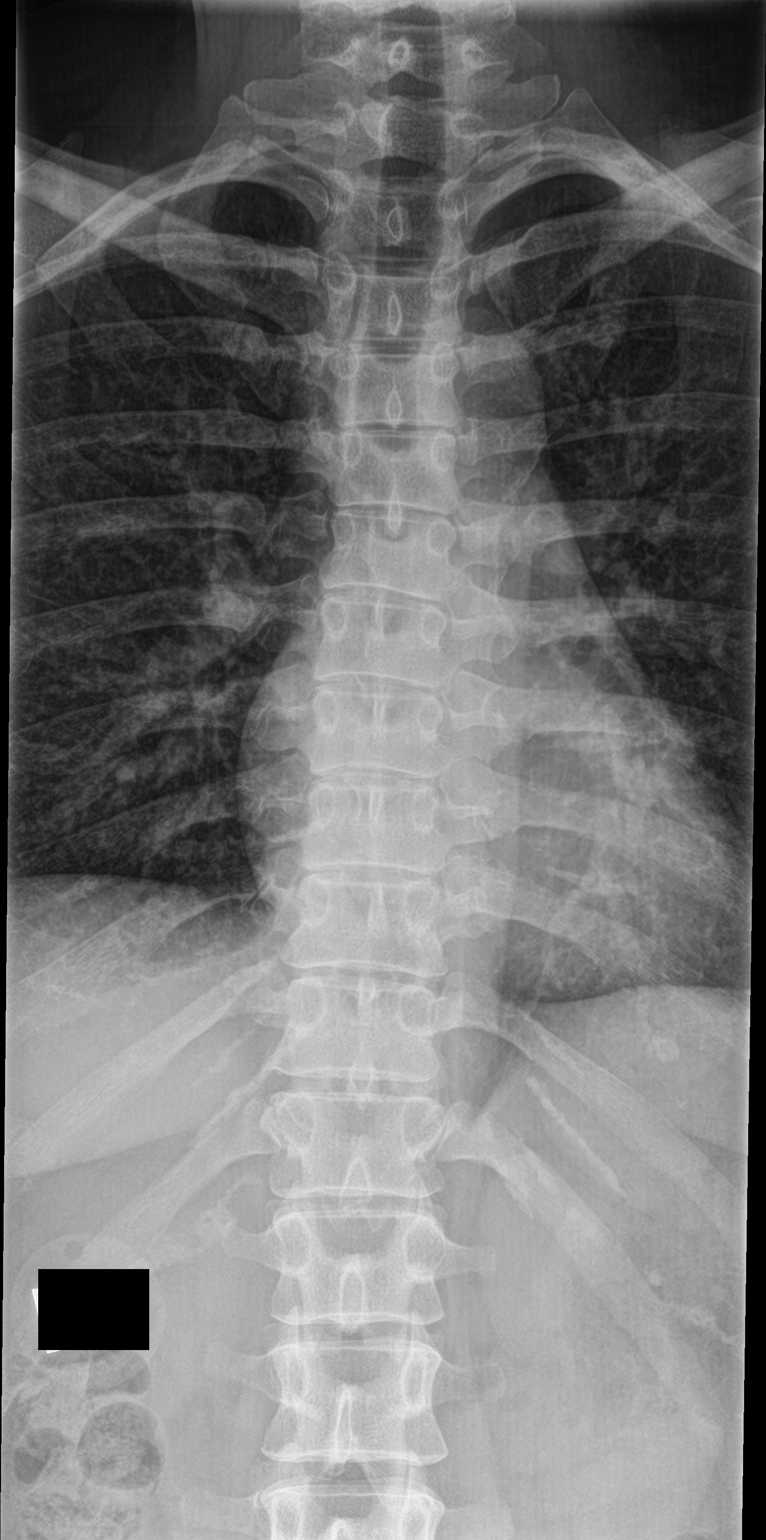
[im 2/3]
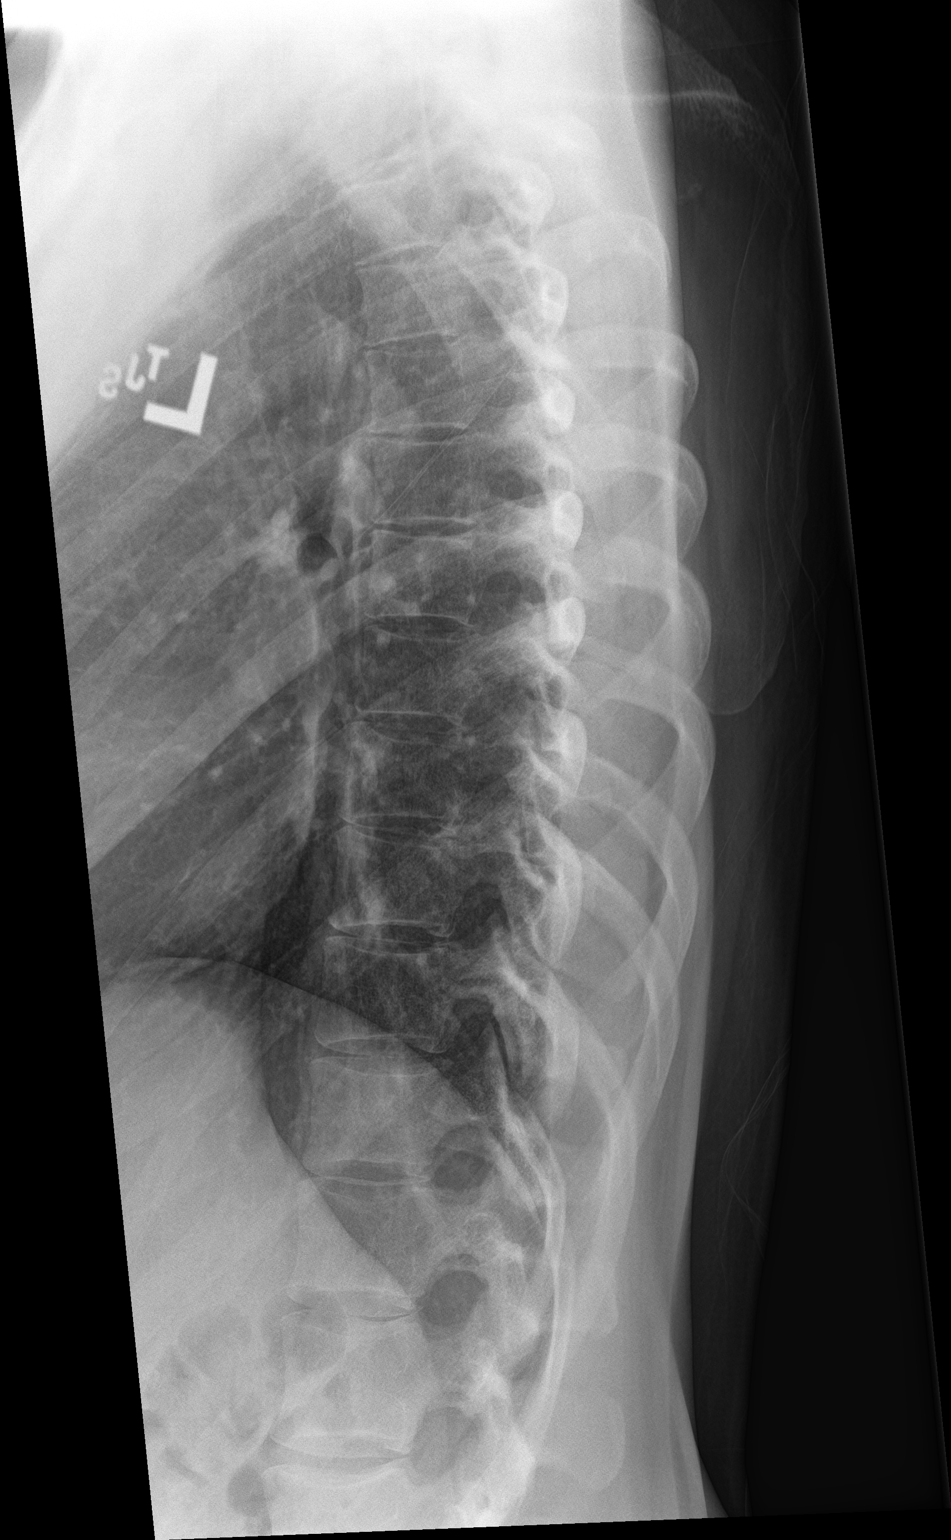
[im 3/3]
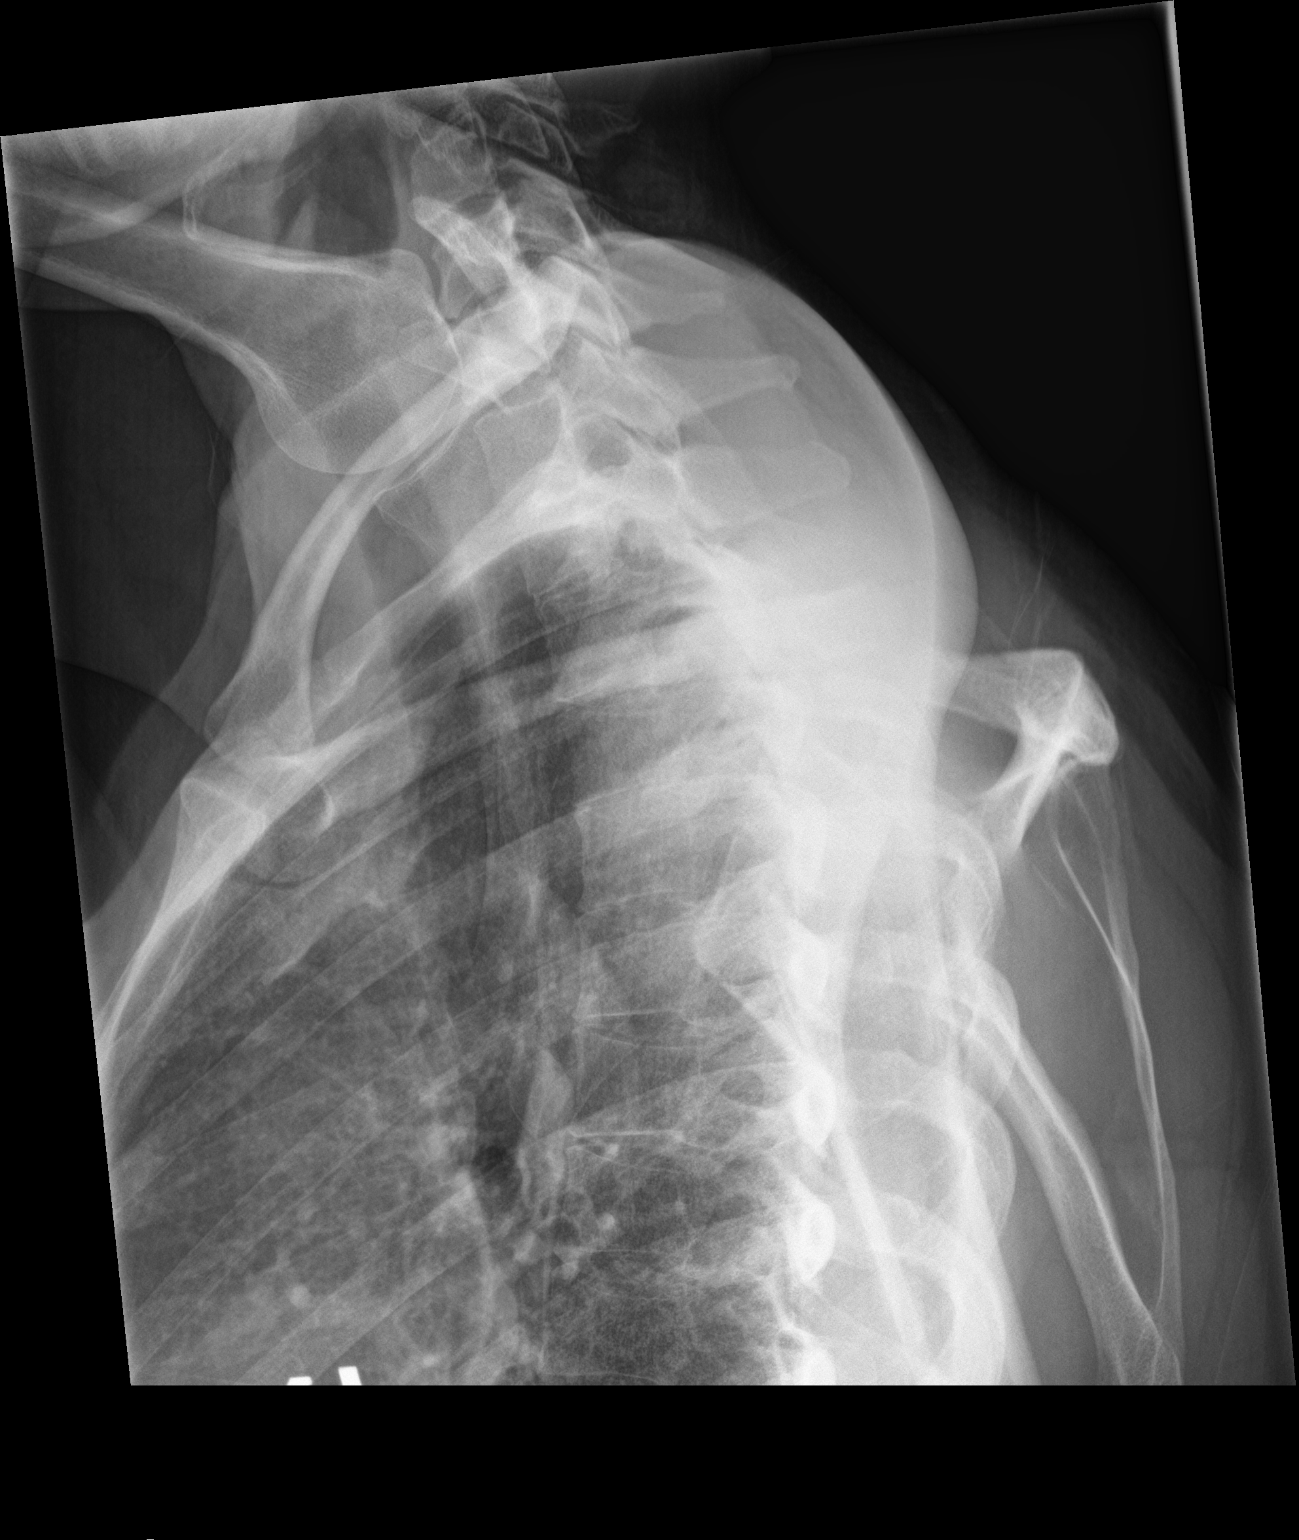

[3 of 3 positions shown; findings below may reference images not displayed]

FINDINGS: There is no evidence of thoracic spine fracture. Alignment is
normal. No other significant bone abnormalities are identified.
IMPRESSION: Negative exam.

## 2019-10-26 ENCOUNTER — Ambulatory Visit (INDEPENDENT_AMBULATORY_CARE_PROVIDER_SITE_OTHER): Payer: No Typology Code available for payment source | Admitting: Physician Assistant

## 2019-10-26 ENCOUNTER — Other Ambulatory Visit (HOSPITAL_COMMUNITY)
Admission: RE | Admit: 2019-10-26 | Discharge: 2019-10-26 | Disposition: A | Discharge status: Home / Self Care | Payer: No Typology Code available for payment source | Source: Ambulatory Visit | Attending: Physician Assistant | Admitting: Physician Assistant

## 2019-10-26 DIAGNOSIS — N898 Other specified noninflammatory disorders of vagina: Secondary | ICD-10-CM

## 2019-10-26 MED ORDER — FLUCONAZOLE 150 MG PO TABS: ORAL_TABLET | ORAL | 0 refills | Status: DC

## 2019-10-26 NOTE — Progress Notes (Signed)
    MyChart Video Visit    Virtual Visit via Video Note   This visit type was conducted due to national recommendations for restrictions regarding the COVID-19 Pandemic (e.g. social distancing) in an effort to limit this patient's exposure and mitigate transmission in our community. This patient is at least at moderate risk for complications without adequate follow up. This format is felt to be most appropriate for this patient at this time. Physical exam was limited by quality of the video and audio technology used for the visit.   Patient location: Home Provider location: Office    Patient: Erica Fowler   DOB: 02-Jun-1976   44 y.o. Female  MRN: [id number] Visit Date: 10/26/2019  Today's healthcare provider: Trinna Post, PA-C   Chief Complaint  Patient presents with  . Vaginal Discharge  . Nasal Congestion   Subjective    HPI  Patient with history of BV presenting today with one week vaginal burning and pain during sex. Denies new sexual partners, vaginal discharge. Has had BV though this feels different. Denies fevers, chills, pelvic pain, abdominal pain.     Medications: Outpatient Medications Prior to Visit  Medication Sig  . acetaminophen (TYLENOL) 500 MG tablet Take 500 mg by mouth every 6 (six) hours as needed.  . Cyanocobalamin (VITAMIN B 12 PO) Take by mouth.  . fluticasone (FLONASE) 50 MCG/ACT nasal spray Place 2 sprays into both nostrils daily.  Marland Kitchen levonorgestrel (MIRENA) 20 MCG/24HR IUD 1 each by Intrauterine route once.  . loratadine (CLARITIN) 10 MG tablet Take 10 mg by mouth daily.  . Multiple Vitamin (MULTIVITAMIN) tablet Take 1 tablet by mouth daily.  . Pseudoephedrine HCl (SUDAFED PO) Take by mouth.   No facility-administered medications prior to visit.    Review of Systems    Objective    There were no vitals taken for this visit.   Physical Exam     Assessment & Plan    1. Vaginal discharge  Will have her stop by office to self  collect vaginal swab. Treat empirically as below and further if necessary based on swab results.   - Cervicovaginal ancillary only - fluconazole (DIFLUCAN) 150 MG tablet; Take 1 pill on day 1 of symptoms and then a second pill 3 days later if you still have symptoms.  Dispense: 2 tablet; Refill: 0    F/u PRN    I discussed the assessment and treatment plan with the patient. The patient was provided an opportunity to ask questions and all were answered. The patient agreed with the plan and demonstrated an understanding of the instructions.   The patient was advised to call back or seek an in-person evaluation if the symptoms worsen or if the condition fails to improve as anticipated.  ITrinna Post, PA-C, have reviewed all documentation for this visit. The documentation on 10/26/19 for the exam, diagnosis, procedures, and orders are all accurate and complete.   Paulene Floor [facility] [phone] (phone) [phone] (fax)  [place]

## 2019-10-30 LAB — CERVICOVAGINAL ANCILLARY ONLY
Bacterial Vaginitis (gardnerella): NEGATIVE
Candida Glabrata: NEGATIVE
Candida Vaginitis: POSITIVE — AB
Chlamydia: NEGATIVE
Comment: NEGATIVE
Comment: NEGATIVE
Comment: NEGATIVE
Comment: NEGATIVE
Comment: NEGATIVE
Comment: NORMAL
Neisseria Gonorrhea: NEGATIVE
Trichomonas: NEGATIVE

## 2019-11-13 ENCOUNTER — Encounter: Payer: Self-pay | Admitting: Physician Assistant

## 2019-11-13 ENCOUNTER — Ambulatory Visit (INDEPENDENT_AMBULATORY_CARE_PROVIDER_SITE_OTHER): Payer: No Typology Code available for payment source | Admitting: Physician Assistant

## 2019-11-13 ENCOUNTER — Other Ambulatory Visit: Payer: Self-pay

## 2019-11-13 VITALS — BP 115/71 | HR 62 | Temp 96.9°F | Ht 63.0 in | Wt 142.4 lb

## 2019-11-13 DIAGNOSIS — Z114 Encounter for screening for human immunodeficiency virus [HIV]: Secondary | ICD-10-CM | POA: Diagnosis not present

## 2019-11-13 DIAGNOSIS — Z0000 Encounter for general adult medical examination without abnormal findings: Secondary | ICD-10-CM

## 2019-11-13 DIAGNOSIS — Z1159 Encounter for screening for other viral diseases: Secondary | ICD-10-CM

## 2019-11-13 NOTE — Progress Notes (Signed)
Complete physical exam   Patient: Erica Fowler   DOB: 1976-02-24   44 y.o. Female  MRN: [id number] Visit Date: 11/13/2019  Today's healthcare provider: Trinna Post, PA-C   Chief Complaint  Patient presents with  . Annual Exam  I,Fredrick Dray M Joshuah Minella,acting as a scribe for Trinna Post, PA-C.,have documented all relevant documentation on the behalf of Trinna Post, PA-C,as directed by  Trinna Post, PA-C while in the presence of Trinna Post, PA-C.  Subjective    Erica Fowler is a 44 y.o. female who presents today for a complete physical exam.  She reports consuming a general diet. Home exercise routine includes walking. She generally feels well. She reports sleeping fairly well. She does not have additional problems to discuss today.  HPI   Quit smoking in April.   No family history or colon cancer.  Maternal grandmother with breast cancer. Mammogram last year normal. PAP UTD.     Wt Readings from Last 3 Encounters:  11/13/19 142 lb 6.4 oz (64.6 kg)  03/17/18 132 lb 3.2 oz (60 kg)  03/02/18 131 lb 9.6 oz (59.7 kg)     Past Medical History:  Diagnosis Date  . Allergy   . GERD (gastroesophageal reflux disease)   . Headache    Past Surgical History:  Procedure Laterality Date  . LAPAROTOMY Left 2010   Social History   Socioeconomic History  . Marital status: Married    Spouse name: Not on file  . Number of children: Not on file  . Years of education: Not on file  . Highest education level: Not on file  Occupational History  . Not on file  Tobacco Use  . Smoking status: Former Smoker    Packs/day: 0.50    Years: 20.00    Pack years: 10.00    Types: Cigarettes    Quit date: 01/05/2016    Years since quitting: 3.8  . Smokeless tobacco: Never Used  Substance and Sexual Activity  . Alcohol use: Yes    Comment: occasional  . Drug use: No  . Sexual activity: Yes    Birth control/protection: I.U.D.    Comment: mirena  Other  Topics Concern  . Not on file  Social History Narrative  . Not on file   Social Determinants of Health   Financial Resource Strain:   . Difficulty of Paying Living Expenses:   Food Insecurity:   . Worried About Charity fundraiser in the Last Year:   . Arboriculturist in the Last Year:   Transportation Needs:   . Film/video editor (Medical):   Marland Kitchen Lack of Transportation (Non-Medical):   Physical Activity:   . Days of Exercise per Week:   . Minutes of Exercise per Session:   Stress:   . Feeling of Stress :   Social Connections:   . Frequency of Communication with Friends and Family:   . Frequency of Social Gatherings with Friends and Family:   . Attends Religious Services:   . Active Member of Clubs or Organizations:   . Attends Archivist Meetings:   Marland Kitchen Marital Status:   Intimate Partner Violence:   . Fear of Current or Ex-Partner:   . Emotionally Abused:   Marland Kitchen Physically Abused:   . Sexually Abused:    Family Status  Relation Name Status  . Father  Alive  . Mother  Alive  . Ethlyn Daniels  (Not Specified)  .  PGM  (Not Specified)   Family History  Problem Relation Age of Onset  . Heart disease Father   . Cancer Father        Prostate  . Breast cancer Paternal Aunt   . Breast cancer Paternal Grandmother    Allergies  Allergen Reactions  . Sulfa Antibiotics     Patient Care Team: Mar Daring, PA-C as PCP - General (Family Medicine)   Medications: Outpatient Medications Prior to Visit  Medication Sig  . acetaminophen (TYLENOL) 500 MG tablet Take 500 mg by mouth every 6 (six) hours as needed.  . fluticasone (FLONASE) 50 MCG/ACT nasal spray Place 2 sprays into both nostrils daily.  Marland Kitchen levonorgestrel (MIRENA) 20 MCG/24HR IUD 1 each by Intrauterine route once.  . loratadine (CLARITIN) 10 MG tablet Take 10 mg by mouth daily.  . Pseudoephedrine HCl (SUDAFED PO) Take by mouth.  . Cyanocobalamin (VITAMIN B 12 PO) Take by mouth.  . fluconazole  (DIFLUCAN) 150 MG tablet Take 1 pill on day 1 of symptoms and then a second pill 3 days later if you still have symptoms.  . Multiple Vitamin (MULTIVITAMIN) tablet Take 1 tablet by mouth daily.   No facility-administered medications prior to visit.    Review of Systems  Constitutional: Positive for fatigue.  HENT: Negative.   Eyes: Negative.   Respiratory: Negative.   Cardiovascular: Negative.   Gastrointestinal: Negative.   Endocrine: Positive for heat intolerance.  Genitourinary: Negative.   Musculoskeletal: Negative.   Skin: Negative.   Allergic/Immunologic: Negative.   Neurological: Positive for dizziness and numbness.  Hematological: Negative.   Psychiatric/Behavioral: Negative.       Objective    BP 115/71 (BP Location: Right Arm, Patient Position: Sitting, Cuff Size: Normal)   Pulse 62   Temp (!) 96.9 F (36.1 C) (Temporal)   Ht 5\' 3"  (1.6 m)   Wt 142 lb 6.4 oz (64.6 kg)   SpO2 97%   BMI 25.23 kg/m    Physical Exam Constitutional:      Appearance: Normal appearance.  HENT:     Right Ear: Tympanic membrane, ear canal and external ear normal.     Left Ear: Tympanic membrane, ear canal and external ear normal.  Cardiovascular:     Rate and Rhythm: Normal rate and regular rhythm.     Pulses: Normal pulses.     Heart sounds: Normal heart sounds.  Pulmonary:     Effort: Pulmonary effort is normal.     Breath sounds: Normal breath sounds.  Abdominal:     General: Abdomen is flat. Bowel sounds are normal.     Palpations: Abdomen is soft.  Skin:    General: Skin is warm and dry.  Neurological:     General: No focal deficit present.     Mental Status: She is alert and oriented to person, place, and time.  Psychiatric:        Mood and Affect: Mood normal.        Behavior: Behavior normal.       Depression Screen  PHQ 2/9 Scores 11/13/2019 03/30/2019 02/16/2017  PHQ - 2 Score 0 0 0  PHQ- 9 Score 3 - -    No results found for any visits on 11/13/19.   Assessment & Plan    Routine Health Maintenance and Physical Exam  Exercise Activities and Dietary recommendations Goals   None     Immunization History  Administered Date(s) Administered  . Influenza, Seasonal, Injecte, Preservative Fre 02/25/2015  .  Influenza,inj,Quad PF,6+ Mos 02/16/2017, 03/02/2018  . Influenza-Unspecified 03/07/2019  . PFIZER SARS-COV-2 Vaccination 06/26/2019, 07/17/2019  . Tdap 02/16/2017    Health Maintenance  Topic Date Due  . Hepatitis C Screening  Never done  . HIV Screening  Never done  . INFLUENZA VACCINE  01/06/2020  . PAP SMEAR-Modifier  05/07/2021  . TETANUS/TDAP  02/17/2027  . COVID-19 Vaccine  Completed    Discussed health benefits of physical activity, and encouraged her to engage in regular exercise appropriate for her age and condition.  1. Annual physical exam  - HIV Antibody (routine testing w rflx) - TSH - Lipid panel - Comprehensive metabolic panel - CBC with Differential/Platelet - Direct LDL  2. Encounter for hepatitis C screening test for low risk patient  - Hepatitis c antibody (reflex)  3. Encounter for screening for HIV  - Hepatitis c antibody (reflex)    Return in about 1 year (around 11/12/2020) for CPE.     ITrinna Post, PA-C, have reviewed all documentation for this visit. The documentation on 11/13/19 for the exam, diagnosis, procedures, and orders are all accurate and complete.    Paulene Floor  [facility] [phone] (phone) [phone] (fax)  [place]

## 2019-11-13 NOTE — Patient Instructions (Signed)
Health Maintenance, Female Adopting a healthy lifestyle and getting preventive care are important in promoting health and wellness. Ask your health care provider about:  The right schedule for you to have regular tests and exams.  Things you can do on your own to prevent diseases and keep yourself healthy. What should I know about diet, weight, and exercise? Eat a healthy diet   Eat a diet that includes plenty of vegetables, fruits, low-fat dairy products, and lean protein.  Do not eat a lot of foods that are high in solid fats, added sugars, or sodium. Maintain a healthy weight Body mass index (BMI) is used to identify weight problems. It estimates body fat based on height and weight. Your health care provider can help determine your BMI and help you achieve or maintain a healthy weight. Get regular exercise Get regular exercise. This is one of the most important things you can do for your health. Most adults should:  Exercise for at least 150 minutes each week. The exercise should increase your heart rate and make you sweat (moderate-intensity exercise).  Do strengthening exercises at least twice a week. This is in addition to the moderate-intensity exercise.  Spend less time sitting. Even light physical activity can be beneficial. Watch cholesterol and blood lipids Have your blood tested for lipids and cholesterol at 43 years of age, then have this test every 5 years. Have your cholesterol levels checked more often if:  Your lipid or cholesterol levels are high.  You are older than 43 years of age.  You are at high risk for heart disease. What should I know about cancer screening? Depending on your health history and family history, you may need to have cancer screening at various ages. This may include screening for:  Breast cancer.  Cervical cancer.  Colorectal cancer.  Skin cancer.  Lung cancer. What should I know about heart disease, diabetes, and high blood  pressure? Blood pressure and heart disease  High blood pressure causes heart disease and increases the risk of stroke. This is more likely to develop in people who have high blood pressure readings, are of African descent, or are overweight.  Have your blood pressure checked: ? Every 3-5 years if you are 18-39 years of age. ? Every year if you are 40 years old or older. Diabetes Have regular diabetes screenings. This checks your fasting blood sugar level. Have the screening done:  Once every three years after age 40 if you are at a normal weight and have a low risk for diabetes.  More often and at a younger age if you are overweight or have a high risk for diabetes. What should I know about preventing infection? Hepatitis B If you have a higher risk for hepatitis B, you should be screened for this virus. Talk with your health care provider to find out if you are at risk for hepatitis B infection. Hepatitis C Testing is recommended for:  Everyone born from 1945 through 1965.  Anyone with known risk factors for hepatitis C. Sexually transmitted infections (STIs)  Get screened for STIs, including gonorrhea and chlamydia, if: ? You are sexually active and are younger than 43 years of age. ? You are older than 43 years of age and your health care provider tells you that you are at risk for this type of infection. ? Your sexual activity has changed since you were last screened, and you are at increased risk for chlamydia or gonorrhea. Ask your health care provider if   you are at risk.  Ask your health care provider about whether you are at high risk for HIV. Your health care provider may recommend a prescription medicine to help prevent HIV infection. If you choose to take medicine to prevent HIV, you should first get tested for HIV. You should then be tested every 3 months for as long as you are taking the medicine. Pregnancy  If you are about to stop having your period (premenopausal) and  you may become pregnant, seek counseling before you get pregnant.  Take 400 to 800 micrograms (mcg) of folic acid every day if you become pregnant.  Ask for birth control (contraception) if you want to prevent pregnancy. Osteoporosis and menopause Osteoporosis is a disease in which the bones lose minerals and strength with aging. This can result in bone fractures. If you are 65 years old or older, or if you are at risk for osteoporosis and fractures, ask your health care provider if you should:  Be screened for bone loss.  Take a calcium or vitamin D supplement to lower your risk of fractures.  Be given hormone replacement therapy (HRT) to treat symptoms of menopause. Follow these instructions at home: Lifestyle  Do not use any products that contain nicotine or tobacco, such as cigarettes, e-cigarettes, and chewing tobacco. If you need help quitting, ask your health care provider.  Do not use street drugs.  Do not share needles.  Ask your health care provider for help if you need support or information about quitting drugs. Alcohol use  Do not drink alcohol if: ? Your health care provider tells you not to drink. ? You are pregnant, may be pregnant, or are planning to become pregnant.  If you drink alcohol: ? Limit how much you use to 0-1 drink a day. ? Limit intake if you are breastfeeding.  Be aware of how much alcohol is in your drink. In the [place] one drink equals one 12 oz bottle of beer (355 mL), one 5 oz glass of wine (148 mL), or one 1 oz glass of hard liquor (44 mL). General instructions  Schedule regular health, dental, and eye exams.  Stay current with your vaccines.  Tell your health care provider if: ? You often feel depressed. ? You have ever been abused or do not feel safe at home. Summary  Adopting a healthy lifestyle and getting preventive care are important in promoting health and wellness.  Follow your health care provider's instructions about healthy  diet, exercising, and getting tested or screened for diseases.  Follow your health care provider's instructions on monitoring your cholesterol and blood pressure. This information is not intended to replace advice given to you by your health care provider. Make sure you discuss any questions you have with your health care provider. Document Revised: 05/17/2018 Document Reviewed: 05/17/2018 Elsevier Patient Education  2020 [employer]  

## 2019-11-14 LAB — CBC WITH DIFFERENTIAL/PLATELET
Basophils Absolute: 0.1 10*3/uL (ref 0.0–0.2)
Basos: 1 %
EOS (ABSOLUTE): 0.3 10*3/uL (ref 0.0–0.4)
Eos: 3 %
Hematocrit: 43.6 % (ref 34.0–46.6)
Hemoglobin: 14.7 g/dL (ref 11.1–15.9)
Immature Grans (Abs): 0 10*3/uL (ref 0.0–0.1)
Immature Granulocytes: 0 %
Lymphocytes Absolute: 2.1 10*3/uL (ref 0.7–3.1)
Lymphs: 25 %
MCH: 29.6 pg (ref 26.6–33.0)
MCHC: 33.7 g/dL (ref 31.5–35.7)
MCV: 88 fL (ref 79–97)
Monocytes Absolute: 0.8 10*3/uL (ref 0.1–0.9)
Monocytes: 9 %
Neutrophils Absolute: 5.3 10*3/uL (ref 1.4–7.0)
Neutrophils: 62 %
Platelets: 281 10*3/uL (ref 150–450)
RBC: 4.97 x10E6/uL (ref 3.77–5.28)
RDW: 12.1 % (ref 11.7–15.4)
WBC: 8.5 10*3/uL (ref 3.4–10.8)

## 2019-11-14 LAB — COMPREHENSIVE METABOLIC PANEL
ALT: 17 IU/L (ref 0–32)
AST: 20 IU/L (ref 0–40)
Albumin/Globulin Ratio: 1.9 (ref 1.2–2.2)
Albumin: 4.7 g/dL (ref 3.8–4.8)
Alkaline Phosphatase: 41 IU/L — ABNORMAL LOW (ref 48–121)
BUN/Creatinine Ratio: 19 (ref 9–23)
BUN: 13 mg/dL (ref 6–24)
Bilirubin Total: 0.4 mg/dL (ref 0.0–1.2)
CO2: 22 mmol/L (ref 20–29)
Calcium: 9.3 mg/dL (ref 8.7–10.2)
Chloride: 100 mmol/L (ref 96–106)
Creatinine, Ser: 0.69 mg/dL (ref 0.57–1.00)
GFR calc Af Amer: 124 mL/min/{1.73_m2} (ref >59)
GFR calc non Af Amer: 108 mL/min/{1.73_m2} (ref >59)
Globulin, Total: 2.5 g/dL (ref 1.5–4.5)
Glucose: 86 mg/dL (ref 65–99)
Potassium: 3.9 mmol/L (ref 3.5–5.2)
Sodium: 136 mmol/L (ref 134–144)
Total Protein: 7.2 g/dL (ref 6.0–8.5)

## 2019-11-14 LAB — LIPID PANEL
Chol/HDL Ratio: 3.6 ratio (ref 0.0–4.4)
Cholesterol, Total: 196 mg/dL (ref 100–199)
HDL: 55 mg/dL (ref >39)
LDL Chol Calc (NIH): 118 mg/dL — ABNORMAL HIGH (ref 0–99)
Triglycerides: 128 mg/dL (ref 0–149)
VLDL Cholesterol Cal: 23 mg/dL (ref 5–40)

## 2019-11-14 LAB — TSH: TSH: 1.45 u[IU]/mL (ref 0.450–4.500)

## 2019-11-14 LAB — HEPATITIS C ANTIBODY (REFLEX): HCV Ab: <0.1 s/co ratio (ref 0.0–0.9)

## 2019-11-14 LAB — HCV COMMENT:

## 2019-11-14 LAB — LDL CHOLESTEROL, DIRECT: LDL Direct: 117 mg/dL — ABNORMAL HIGH (ref 0–99)

## 2019-11-14 LAB — HIV ANTIBODY (ROUTINE TESTING W REFLEX): HIV Screen 4th Generation wRfx: NONREACTIVE

## 2020-01-07 ENCOUNTER — Other Ambulatory Visit: Payer: No Typology Code available for payment source

## 2020-01-07 ENCOUNTER — Other Ambulatory Visit: Payer: Self-pay

## 2020-01-07 DIAGNOSIS — Z20822 Contact with and (suspected) exposure to covid-19: Secondary | ICD-10-CM

## 2020-01-08 LAB — NOVEL CORONAVIRUS, NAA: SARS-CoV-2, NAA: NOT DETECTED

## 2020-01-08 LAB — SARS-COV-2, NAA 2 DAY TAT

## 2020-01-10 ENCOUNTER — Telehealth (INDEPENDENT_AMBULATORY_CARE_PROVIDER_SITE_OTHER): Payer: No Typology Code available for payment source | Admitting: Physician Assistant

## 2020-01-10 DIAGNOSIS — J029 Acute pharyngitis, unspecified: Secondary | ICD-10-CM | POA: Diagnosis not present

## 2020-01-10 NOTE — Progress Notes (Signed)
MyChart Video Visit    Virtual Visit via Video Note   This visit type was conducted due to national recommendations for restrictions regarding the COVID-19 Pandemic (e.g. social distancing) in an effort to limit this patient's exposure and mitigate transmission in our community. This patient is at least at moderate risk for complications without adequate follow up. This format is felt to be most appropriate for this patient at this time. Physical exam was limited by quality of the video and audio technology used for the visit.   Patient location: Home Provider location: Office    I discussed the limitations of evaluation and management by telemedicine and the availability of in person appointments. The patient expressed understanding and agreed to proceed.    Patient: Erica Fowler   DOB: Aug 24, 1975   44 y.o. Female  MRN: [id number] Visit Date: 01/10/2020  Today's healthcare provider: Trinna Post, PA-C   Chief Complaint  Patient presents with  . Sore Throat  I,Porsha C McClurkin,acting as a scribe for Trinna Post, PA-C.,have documented all relevant documentation on the behalf of Trinna Post, PA-C,as directed by  Trinna Post, PA-C while in the presence of Trinna Post, PA-C.  Subjective    Sore Throat  This is a new problem. The current episode started in the past 7 days. The problem has been unchanged. There has been no fever. Associated symptoms include congestion and coughing. Pertinent negatives include no diarrhea, ear pain, headaches, hoarse voice, shortness of breath, trouble swallowing or vomiting. She has tried nothing for the symptoms. The treatment provided no relief.  COVID test came back negative. She has been vaccinated with two doses of [place], second dose in 07/2019.   She had some PND and sore throat earlier this week. She had a COVID test Monday 01/07/2020.      Medications: Outpatient Medications Prior to Visit  Medication Sig  .  acetaminophen (TYLENOL) 500 MG tablet Take 500 mg by mouth every 6 (six) hours as needed.  . fluticasone (FLONASE) 50 MCG/ACT nasal spray Place 2 sprays into both nostrils daily.  Marland Kitchen levonorgestrel (MIRENA) 20 MCG/24HR IUD 1 each by Intrauterine route once.  . loratadine (CLARITIN) 10 MG tablet Take 10 mg by mouth daily.  . Pseudoephedrine HCl (SUDAFED PO) Take by mouth.  . Cyanocobalamin (VITAMIN B 12 PO) Take by mouth.  . fluconazole (DIFLUCAN) 150 MG tablet Take 1 pill on day 1 of symptoms and then a second pill 3 days later if you still have symptoms.  . Multiple Vitamin (MULTIVITAMIN) tablet Take 1 tablet by mouth daily.   No facility-administered medications prior to visit.    Review of Systems  Constitutional: Negative for chills, fatigue and fever.  HENT: Positive for congestion, postnasal drip, sneezing and sore throat. Negative for ear pain, hoarse voice, rhinorrhea, sinus pressure, sinus pain and trouble swallowing.   Respiratory: Positive for cough. Negative for chest tightness, shortness of breath and wheezing.   Gastrointestinal: Negative for diarrhea and vomiting.  Neurological: Positive for dizziness and light-headedness. Negative for weakness and headaches.      Objective    There were no vitals taken for this visit.   Physical Exam Constitutional:      Appearance: Normal appearance.  Pulmonary:     Effort: Pulmonary effort is normal. No respiratory distress.  Neurological:     Mental Status: She is alert.  Psychiatric:        Mood and Affect: Mood normal.  Behavior: Behavior normal.        Assessment & Plan    1. Sore throat  Counseled regarding signs and symptoms of viral and bacterial respiratory infections. Advised to call or return for additional evaluation if she develops any sign of bacterial infection, or if current symptoms last longer than 10 days. Discussed OTC remedies like ibuprofen, tylenol, delsym.      No follow-ups on file.      I discussed the assessment and treatment plan with the patient. The patient was provided an opportunity to ask questions and all were answered. The patient agreed with the plan and demonstrated an understanding of the instructions.   The patient was advised to call back or seek an in-person evaluation if the symptoms worsen or if the condition fails to improve as anticipated.  ITrinna Post, PA-C, have reviewed all documentation for this visit. The documentation on 02/05/20 for the exam, diagnosis, procedures, and orders are all accurate and complete.   Paulene Floor [facility] [phone] (phone) [phone] (fax)  [place]

## 2020-02-07 ENCOUNTER — Encounter: Payer: Self-pay | Admitting: Physician Assistant

## 2020-02-07 DIAGNOSIS — H539 Unspecified visual disturbance: Secondary | ICD-10-CM

## 2020-02-12 ENCOUNTER — Other Ambulatory Visit
Admission: RE | Admit: 2020-02-12 | Discharge: 2020-02-12 | Disposition: A | Discharge status: Home / Self Care | Payer: No Typology Code available for payment source | Source: Ambulatory Visit | Attending: Ophthalmology | Admitting: Ophthalmology

## 2020-02-12 DIAGNOSIS — H5000 Unspecified esotropia: Secondary | ICD-10-CM | POA: Insufficient documentation

## 2020-02-12 DIAGNOSIS — H532 Diplopia: Secondary | ICD-10-CM | POA: Diagnosis not present

## 2020-02-25 LAB — ACETYLCHOLINE RECEPTOR AB, ALL
Acety choline binding ab: <0.03 nmol/L (ref 0.00–0.24)
Acetylchol Block Ab: 15 % (ref 0–25)
Acetylcholine Modulat Ab: <12 % (ref 0–20)

## 2020-09-22 ENCOUNTER — Telehealth (INDEPENDENT_AMBULATORY_CARE_PROVIDER_SITE_OTHER): Payer: No Typology Code available for payment source | Admitting: Family Medicine

## 2020-09-22 ENCOUNTER — Other Ambulatory Visit: Payer: Self-pay

## 2020-09-22 DIAGNOSIS — F439 Reaction to severe stress, unspecified: Secondary | ICD-10-CM | POA: Diagnosis not present

## 2020-09-22 DIAGNOSIS — F5104 Psychophysiologic insomnia: Secondary | ICD-10-CM

## 2020-09-22 DIAGNOSIS — J301 Allergic rhinitis due to pollen: Secondary | ICD-10-CM | POA: Diagnosis not present

## 2020-09-22 MED ORDER — ZOLPIDEM TARTRATE 5 MG PO TABS
5.0000 mg | ORAL_TABLET | Freq: Every evening | ORAL | 1 refills | Status: DC | PRN
  Filled 2020-09-22: qty 20, 20d supply, fill #0
  Filled 2021-02-25 – 2021-02-26 (×3): qty 20, 20d supply, fill #1

## 2020-09-22 MED ORDER — FLUTICASONE PROPIONATE 50 MCG/ACT NA SUSP
2.0000 | Freq: Every day | NASAL | 6 refills | Status: DC
  Filled 2020-09-22: qty 16, 30d supply, fill #0

## 2020-09-22 MED ORDER — VENLAFAXINE HCL ER 37.5 MG PO CP24
37.5000 mg | ORAL_CAPSULE | Freq: Every day | ORAL | 1 refills | Status: DC
Start: 2020-09-22 — End: 2020-10-13
  Filled 2020-09-22: qty 30, 30d supply, fill #0

## 2020-09-22 NOTE — Patient Instructions (Signed)

## 2020-09-22 NOTE — Progress Notes (Signed)
MyChart Video Visit    Virtual Visit via Video Note   This visit type was conducted due to national recommendations for restrictions regarding the COVID-19 Pandemic (e.g. social distancing) in an effort to limit this patient's exposure and mitigate transmission in our community. This patient is at least at moderate risk for complications without adequate follow up. This format is felt to be most appropriate for this patient at this time. Physical exam was limited by quality of the video and audio technology used for the visit.   Patient location: home Provider location: [facility]  I discussed the limitations of evaluation and management by telemedicine and the availability of in person appointments. The patient expressed understanding and agreed to proceed.  Patient: Erica Fowler   DOB: 04-05-1976   45 y.o. Female  MRN: [id number] Visit Date: 09/22/2020  Today's healthcare provider: Lelon Huh, MD   No chief complaint on file.  Subjective    HPI   Anxiety: Patient complains of anxiety.  She has the following symptoms: fatigue, insomnia, racing thoughts. Onset of symptoms was approximately 4 month ago, gradually worsening since that time. She denies current suicidal and homicidal ideation. Family history significant for no psychiatric illness. Possible organic causes contributing are: n/a. Risk factors: none Previous treatment includes n/a and none.  She complains of the following side effects from the treatment: none.   Patient c/o of difficulty sleeping for the past 4-5 months. Patient says she has difficulty falling and staying asleep. Patient says the frequency is almost every night. Patient also c/o of being worried, anxious, and having racing thoughts that stem from her job. Patient is unsure wether the difficulty sleeping or anxious/racing thoughts preceded the other. Is not sleeping well. Is worse during work week. Has had similar episodes in the past, but never this bad.  Drinks caffeine in the mornings only. No other stimulant medications.   Taking Zyrtec and fluticasone for allergies. Rarely takes sudafed. Patient would like a refill on flonase.    Medications: Outpatient Medications Prior to Visit  Medication Sig  . acetaminophen (TYLENOL) 500 MG tablet Take 500 mg by mouth every 6 (six) hours as needed.  . Cyanocobalamin (VITAMIN B 12 PO) Take by mouth.  . fluconazole (DIFLUCAN) 150 MG tablet Take 1 pill on day 1 of symptoms and then a second pill 3 days later if you still have symptoms.  . fluticasone (FLONASE) 50 MCG/ACT nasal spray Place 2 sprays into both nostrils daily.  Marland Kitchen levonorgestrel (MIRENA) 20 MCG/24HR IUD 1 each by Intrauterine route once.  . loratadine (CLARITIN) 10 MG tablet Take 10 mg by mouth daily.  . Multiple Vitamin (MULTIVITAMIN) tablet Take 1 tablet by mouth daily.  . Pseudoephedrine HCl (SUDAFED PO) Take by mouth.   No facility-administered medications prior to visit.    Review of Systems    Objective    There were no vitals taken for this visit.   Physical Exam   Awake, alert, oriented x 3. In no apparent distress   Assessment & Plan     1. Situational stress Trial of - venlafaxine XR (EFFEXOR XR) 37.5 MG 24 hr capsule; Take 1 capsule (37.5 mg total) by mouth daily with breakfast.  Dispense: 30 capsule; Refill: 1  Encourage follow up with counselor or psychologist.   2. Psychophysiological insomnia Trial of- zolpidem (AMBIEN) 5 MG tablet; Take 1 tablet (5 mg total) by mouth at bedtime as needed for sleep.  Dispense: 20 tablet; Refill: 1  3.  Allergic rhinitis due to pollen, unspecified seasonality refill- fluticasone (FLONASE) 50 MCG/ACT nasal spray; Place 2 sprays into both nostrils daily.  Dispense: 16 g; Refill: 6       I discussed the assessment and treatment plan with the patient. The patient was provided an opportunity to ask questions and all were answered. The patient agreed with the plan and  demonstrated an understanding of the instructions.   The patient was advised to call back or seek an in-person evaluation if the symptoms worsen or if the condition fails to improve as anticipated.  I provided 11 minutes of non-face-to-face time during this encounter.  Video connection was lost when less than 50% of the duration of the visit was complete, at which time the remainder of the visit was completed via audio only.  The entirety of the information documented in the History of Present Illness, Review of Systems and Physical Exam were personally obtained by me. Portions of this information were initially documented by the CMA and reviewed by me for thoroughness and accuracy.     Lelon Huh, MD [facility] [phone] (phone) [phone] (fax)  [place]

## 2020-10-13 ENCOUNTER — Telehealth (INDEPENDENT_AMBULATORY_CARE_PROVIDER_SITE_OTHER): Payer: No Typology Code available for payment source | Admitting: Family Medicine

## 2020-10-13 ENCOUNTER — Other Ambulatory Visit: Payer: Self-pay

## 2020-10-13 DIAGNOSIS — F5104 Psychophysiologic insomnia: Secondary | ICD-10-CM

## 2020-10-13 DIAGNOSIS — F439 Reaction to severe stress, unspecified: Secondary | ICD-10-CM

## 2020-10-13 MED ORDER — VENLAFAXINE HCL ER 75 MG PO CP24
75.0000 mg | ORAL_CAPSULE | Freq: Every day | ORAL | 1 refills | Status: DC
  Filled 2020-10-13: qty 30, 30d supply, fill #0
  Filled 2020-11-17: qty 30, 30d supply, fill #1

## 2020-10-13 NOTE — Progress Notes (Signed)
MyChart Video Visit    Virtual Visit via Video Note   This visit type was conducted due to national recommendations for restrictions regarding the COVID-19 Pandemic (e.g. social distancing) in an effort to limit this patient's exposure and mitigate transmission in our community. This patient is at least at moderate risk for complications without adequate follow up. This format is felt to be most appropriate for this patient at this time. Physical exam was limited by quality of the video and audio technology used for the visit.   Patient location: home Provider location: [facility]  I discussed the limitations of evaluation and management by telemedicine and the availability of in person appointments. The patient expressed understanding and agreed to proceed.  Patient: Erica Fowler   DOB: Nov 28, 1975   45 y.o. Female  MRN: [id number] Visit Date: 10/13/2020  Today's healthcare provider: Lelon Huh, MD   No chief complaint on file.  Subjective     Situational Stress follow up:   Patient says she feels she has a little improvement with falling asleep but is still waking up several times at night. Patient still feels anxious during the day and that her anxiety fluctuates throughout the day. Patient is currently on trial of - venlafaxine XR (EFFEXOR XR) 37.5 MG 24 hr capsule; Take 1 capsule (37.5 mg total) by mouth daily with breakfast. Patient has excellent compliance with treatment. Patient does not have side effects.     Medications: Outpatient Medications Prior to Visit  Medication Sig  . acetaminophen (TYLENOL) 500 MG tablet Take 500 mg by mouth every 6 (six) hours as needed.  . Cyanocobalamin (VITAMIN B 12 PO) Take by mouth.  . fluconazole (DIFLUCAN) 150 MG tablet Take 1 pill on day 1 of symptoms and then a second pill 3 days later if you still have symptoms.  . fluticasone (FLONASE) 50 MCG/ACT nasal spray Place 2 sprays into both nostrils daily.  Marland Kitchen levonorgestrel (MIRENA) 20  MCG/24HR IUD 1 each by Intrauterine route once.  . loratadine (CLARITIN) 10 MG tablet Take 10 mg by mouth daily.  . Multiple Vitamin (MULTIVITAMIN) tablet Take 1 tablet by mouth daily.  . Pseudoephedrine HCl (SUDAFED PO) Take by mouth.  . venlafaxine XR (EFFEXOR XR) 37.5 MG 24 hr capsule Take 1 capsule (37.5 mg total) by mouth daily with breakfast.  . zolpidem (AMBIEN) 5 MG tablet Take 1 tablet (5 mg total) by mouth at bedtime as needed for sleep.   No facility-administered medications prior to visit.    Review of Systems    Objective    There were no vitals taken for this visit.   Physical Exam   Awake, alert, oriented x 3. In no apparent distress   Assessment & Plan     1. Situational stress Slight improvement with 37.5mg  venlafaxine which she is tolerating well. Increase to - venlafaxine XR (EFFEXOR XR) 75 MG 24 hr capsule; Take 1 capsule (75 mg total) by mouth daily with breakfast.  Dispense: 30 capsule; Refill: 1 Encouraged exercise, yoga, or meditation and to look into seeing counselor. Advised she can send message if she needs to increase dose any more.   2. Psychophysiological insomnia Taking occasional zolpidem which is effecting at getting her to sleep, but still wakes in the middle of the night and has trouble getting back to sleep. She prefers not to change dose. Expect sleep to improve if underlying anxiety gets under better control.         I discussed the  assessment and treatment plan with the patient. The patient was provided an opportunity to ask questions and all were answered. The patient agreed with the plan and demonstrated an understanding of the instructions.   The patient was advised to call back or seek an in-person evaluation if the symptoms worsen or if the condition fails to improve as anticipated.  I provided 9 minutes of non-face-to-face time during this encounter.  The entirety of the information documented in the History of Present Illness,  Review of Systems and Physical Exam were personally obtained by me. Portions of this information were initially documented by the CMA and reviewed by me for thoroughness and accuracy.     Lelon Huh, MD [facility] [phone] (phone) [phone] (fax)  [place]

## 2020-11-17 ENCOUNTER — Other Ambulatory Visit: Payer: Self-pay

## 2020-12-04 ENCOUNTER — Other Ambulatory Visit: Payer: Self-pay | Admitting: Physician Assistant

## 2020-12-04 ENCOUNTER — Encounter: Payer: Self-pay | Admitting: Physician Assistant

## 2020-12-04 ENCOUNTER — Other Ambulatory Visit: Payer: Self-pay

## 2020-12-04 MED ORDER — PROMETHAZINE-DM 6.25-15 MG/5ML PO SOLN
ORAL | 0 refills | Status: DC
  Filled 2020-12-04: qty 120, 6d supply, fill #0

## 2020-12-13 ENCOUNTER — Other Ambulatory Visit: Payer: Self-pay | Admitting: Family Medicine

## 2020-12-13 DIAGNOSIS — F439 Reaction to severe stress, unspecified: Secondary | ICD-10-CM

## 2020-12-13 NOTE — Telephone Encounter (Signed)
Requested medication (s) are due for refill today  Yes  Requested medication (s) are on the active medication list Yes  Future visit scheduled No. Last visit 10/13/20 MyChart visit  Latest labs-11/13/19  Note to clinic-does not meet protocol lab requirements.   Requested Prescriptions  Pending Prescriptions Disp Refills   venlafaxine XR (EFFEXOR XR) 75 MG 24 hr capsule 30 capsule 1    Sig: Take 1 capsule (75 mg total) by mouth daily with breakfast.      Psychiatry: Antidepressants - SNRI - desvenlafaxine & venlafaxine Failed - 12/13/2020  9:35 AM      Failed - LDL in normal range and within 360 days    LDL Chol Calc (NIH)  Date Value Ref Range Status  11/13/2019 118 (H) 0 - 99 mg/dL Final   LDL Direct  Date Value Ref Range Status  11/13/2019 117 (H) 0 - 99 mg/dL Final          Failed - Total Cholesterol in normal range and within 360 days    Cholesterol, Total  Date Value Ref Range Status  11/13/2019 196 100 - 199 mg/dL Final          Failed - Triglycerides in normal range and within 360 days    Triglycerides  Date Value Ref Range Status  11/13/2019 128 0 - 149 mg/dL Final          Passed - Last BP in normal range    BP Readings from Last 1 Encounters:  11/13/19 115/71          Passed - Valid encounter within last 6 months    Recent Outpatient Visits           2 months ago [place], Donald E, MD   2 months ago [place], Donald E, MD   11 months ago Sore throat   [facility] Trinna Post, [place]   1 year ago Annual physical exam   [facility] Trinna Post, [place]   1 year ago Vaginal discharge   [place], [place], [place]

## 2020-12-14 MED ORDER — VENLAFAXINE HCL ER 75 MG PO CP24
75.0000 mg | ORAL_CAPSULE | Freq: Every day | ORAL | 1 refills | Status: DC
  Filled 2020-12-14: qty 90, 90d supply, fill #0
  Filled 2021-03-19: qty 90, 90d supply, fill #1

## 2020-12-15 ENCOUNTER — Other Ambulatory Visit: Payer: Self-pay

## 2021-02-26 ENCOUNTER — Other Ambulatory Visit: Payer: Self-pay

## 2021-03-20 ENCOUNTER — Other Ambulatory Visit: Payer: Self-pay

## 2021-05-06 ENCOUNTER — Encounter: Payer: Self-pay | Admitting: Nurse Practitioner

## 2021-05-06 ENCOUNTER — Other Ambulatory Visit: Payer: Self-pay

## 2021-05-06 MED ORDER — MUPIROCIN 2 % EX OINT
TOPICAL_OINTMENT | CUTANEOUS | 0 refills | Status: DC
  Filled 2021-05-06: qty 22, 7d supply, fill #0

## 2021-05-06 NOTE — Telephone Encounter (Signed)
This is a [facility] patient.  Please send accordingly.

## 2021-06-30 ENCOUNTER — Other Ambulatory Visit: Payer: Self-pay

## 2021-06-30 ENCOUNTER — Other Ambulatory Visit: Payer: Self-pay | Admitting: Family Medicine

## 2021-06-30 DIAGNOSIS — F439 Reaction to severe stress, unspecified: Secondary | ICD-10-CM

## 2021-06-30 MED FILL — Venlafaxine HCl Cap ER 24HR 75 MG (Base Equivalent): ORAL | 90 days supply | Qty: 90 | Fill #0 | Status: AC

## 2021-07-01 ENCOUNTER — Other Ambulatory Visit: Payer: Self-pay

## 2021-07-10 ENCOUNTER — Encounter: Payer: Self-pay | Admitting: Nurse Practitioner

## 2021-07-10 ENCOUNTER — Other Ambulatory Visit: Payer: Self-pay

## 2021-07-10 ENCOUNTER — Ambulatory Visit (INDEPENDENT_AMBULATORY_CARE_PROVIDER_SITE_OTHER): Payer: No Typology Code available for payment source | Admitting: Nurse Practitioner

## 2021-07-10 VITALS — BP 114/73 | HR 89 | Temp 98.3°F | Ht 62.4 in | Wt 141.6 lb

## 2021-07-10 DIAGNOSIS — K1379 Other lesions of oral mucosa: Secondary | ICD-10-CM | POA: Diagnosis not present

## 2021-07-10 DIAGNOSIS — Z7689 Persons encountering health services in other specified circumstances: Secondary | ICD-10-CM | POA: Diagnosis not present

## 2021-07-10 NOTE — Progress Notes (Signed)
BP 114/73    Pulse 89    Temp 98.3 F (36.8 C) (Oral)    Ht 5' 2.4" (1.585 m)    Wt 141 lb 9.6 oz (64.2 kg)    LMP  (LMP Unknown)    SpO2 99%    BMI 25.57 kg/m    Subjective:    Patient ID: Erica Fowler, female    DOB: September 19, 1975, 46 y.o.   MRN: [id number]  HPI: Erica Fowler is a 46 y.o. female  Chief Complaint  Patient presents with   New Patient (Initial Visit)    Pt states she has a sore on her bottom lip that will not heal completely. States it has been coming and going for the past 2 years. States it is painful sometimes and sometimes bleeds.    Patient presents to clinic to establish care with new PCP.  Introduced to Designer, jewellery role and practice setting.  All questions answered.  Discussed provider/patient relationship and expectations.  Patient reports a history of high cholesterol she has not been on medication she has been managing with diet and exercise, anxiety- on Effexor 75mg  daily and feels like it is working well for her.  She is a current everyday smoker- 1ppd.  Patient denies a history of: Hypertension, Elevated Cholesterol, Diabetes, Thyroid problems, Depression, Neurological problems, and Abdominal problems.   Patient states she has had a sore on her lip on and off for the last two years.  It is in the center of her lip.  She typically puts Vaseline on it but it breaks open and sleeping.  She used mupirocin for 7 days and it improved after 4 but then as soon as she was done it came back.  Patient states over the summer she was seen in her dentist office and had a cut on the corner of her mouth and was told to take B12 and it immediately cleared up.  Active Ambulatory Problems    Diagnosis Date Noted   No Active Ambulatory Problems   Resolved Ambulatory Problems    Diagnosis Date Noted   No Resolved Ambulatory Problems   Past Medical History:  Diagnosis Date   Allergy    Anxiety    GERD (gastroesophageal reflux disease)    Headache    Past  Surgical History:  Procedure Laterality Date   CESAREAN SECTION  1997   LAPAROTOMY Left 06/07/2008   Family History  Problem Relation Age of Onset   COPD Mother    Heart disease Father    Cancer Father        Prostate   Autoimmune disease Sister    Breast cancer Paternal Aunt    COPD Maternal Grandmother    Diabetes Maternal Grandmother    Renal Disease Maternal Grandmother    Diabetes Maternal Grandfather    Heart disease Maternal Grandfather    Hypertension Paternal Grandmother    Stroke Paternal Grandmother    Breast cancer Paternal Grandmother    Heart disease Paternal Grandfather    Lung cancer Paternal Grandfather      Review of Systems  HENT:         Cut on lip   Per HPI unless specifically indicated above     Objective:    BP 114/73    Pulse 89    Temp 98.3 F (36.8 C) (Oral)    Ht 5' 2.4" (1.585 m)    Wt 141 lb 9.6 oz (64.2 kg)    LMP  (LMP Unknown)  SpO2 99%    BMI 25.57 kg/m   Wt Readings from Last 3 Encounters:  07/10/21 141 lb 9.6 oz (64.2 kg)  11/13/19 142 lb 6.4 oz (64.6 kg)  03/17/18 132 lb 3.2 oz (60 kg)    Physical Exam Vitals and nursing note reviewed.  Constitutional:      General: She is not in acute distress.    Appearance: Normal appearance. She is normal weight. She is not ill-appearing, toxic-appearing or diaphoretic.  HENT:     Head: Normocephalic.     Right Ear: External ear normal.     Left Ear: External ear normal.     Nose: Nose normal.     Mouth/Throat:     Mouth: Mucous membranes are moist.     Pharynx: Oropharynx is clear.   Eyes:     General:        Right eye: No discharge.        Left eye: No discharge.     Extraocular Movements: Extraocular movements intact.     Conjunctiva/sclera: Conjunctivae normal.     Pupils: Pupils are equal, round, and reactive to light.  Cardiovascular:     Rate and Rhythm: Normal rate and regular rhythm.     Heart sounds: No murmur heard. Pulmonary:     Effort: Pulmonary effort is  normal. No respiratory distress.     Breath sounds: Normal breath sounds. No wheezing or rales.  Musculoskeletal:     Cervical back: Normal range of motion and neck supple.  Skin:    General: Skin is warm and dry.     Capillary Refill: Capillary refill takes less than 2 seconds.  Neurological:     General: No focal deficit present.     Mental Status: She is alert and oriented to person, place, and time. Mental status is at baseline.  Psychiatric:        Mood and Affect: Mood normal.        Behavior: Behavior normal.        Thought Content: Thought content normal.        Judgment: Judgment normal.    Results for orders placed or performed during the hospital encounter of 02/12/20  Acetylcholine Receptor Ab, All  Result Value Ref Range   Acety choline binding ab <0.03 0.00 - 0.24 nmol/L   Acetylchol Block Ab 15 0 - 25 %   Acetylcholine Modulat Ab <12 0 - 20 %      Assessment & Plan:   Problem List Items Addressed This Visit   None Visit Diagnoses     Mouth sores    -  Primary   Will give mupiricin x1 month. Recommend humidifer, vaseline BID and increasing water intake.  If not improved will send to Dermatology.   Encounter to establish care            Follow up plan: Return in about 1 month (around 08/07/2021) for Physical and Fasting labs and follow up on lip.

## 2021-07-20 ENCOUNTER — Encounter: Payer: No Typology Code available for payment source | Admitting: Nurse Practitioner

## 2021-08-05 ENCOUNTER — Ambulatory Visit (INDEPENDENT_AMBULATORY_CARE_PROVIDER_SITE_OTHER): Payer: No Typology Code available for payment source | Admitting: Nurse Practitioner

## 2021-08-05 ENCOUNTER — Other Ambulatory Visit: Payer: Self-pay

## 2021-08-05 ENCOUNTER — Encounter: Payer: Self-pay | Admitting: Nurse Practitioner

## 2021-08-05 VITALS — BP 108/69 | HR 77 | Temp 99.0°F | Ht 63.2 in | Wt 144.8 lb

## 2021-08-05 DIAGNOSIS — Z136 Encounter for screening for cardiovascular disorders: Secondary | ICD-10-CM | POA: Diagnosis not present

## 2021-08-05 DIAGNOSIS — F419 Anxiety disorder, unspecified: Secondary | ICD-10-CM | POA: Insufficient documentation

## 2021-08-05 DIAGNOSIS — Z1231 Encounter for screening mammogram for malignant neoplasm of breast: Secondary | ICD-10-CM

## 2021-08-05 DIAGNOSIS — Z0000 Encounter for general adult medical examination without abnormal findings: Secondary | ICD-10-CM | POA: Diagnosis not present

## 2021-08-05 LAB — URINALYSIS, ROUTINE W REFLEX MICROSCOPIC
Bilirubin, UA: NEGATIVE
Glucose, UA: NEGATIVE
Ketones, UA: NEGATIVE
Leukocytes,UA: NEGATIVE
Nitrite, UA: NEGATIVE
Protein,UA: NEGATIVE
RBC, UA: NEGATIVE
Specific Gravity, UA: 1.015 (ref 1.005–1.030)
Urobilinogen, Ur: 0.2 mg/dL (ref 0.2–1.0)
pH, UA: 7 (ref 5.0–7.5)

## 2021-08-05 NOTE — Progress Notes (Signed)
BP 108/69    Pulse 77    Temp 99 F (37.2 C) (Oral)    Ht 5' 3.2" (1.605 m)    Wt 144 lb 12.8 oz (65.7 kg)    LMP  (LMP Unknown)    SpO2 99%    BMI 25.49 kg/m    Subjective:    Patient ID: Erica Fowler, female    DOB: Jul 18, 1975, 46 y.o.   MRN: [id number]  HPI: Erica Fowler is a 46 y.o. female presenting on 08/05/2021 for comprehensive medical examination. Current medical complaints include:none  She currently lives with: Menopausal Symptoms: no  Patient states the sore on her mouth is definitely better than her last visit.  She has been using the humidifier has been really beneficial for her.    Denies HA, CP, SOB, dizziness, palpitations, visual changes, and lower extremity swelling.  DEPRESSION Patient states she feels like her stress is well controlled.   Depression Screen done today and results listed below:  Depression screen [facility] 2/9 08/05/2021 07/10/2021 11/13/2019 03/30/2019 02/16/2017  Decreased Interest 1 0 0 0 0  Down, Depressed, Hopeless 0 0 0 0 0  PHQ - 2 Score 1 0 0 0 0  Altered sleeping 2 2 2  - -  Tired, decreased energy 1 1 1  - -  Change in appetite 0 0 0 - -  Feeling bad or failure about yourself  0 0 0 - -  Trouble concentrating 0 0 0 - -  Moving slowly or fidgety/restless 0 0 0 - -  Suicidal thoughts 0 0 0 - -  PHQ-9 Score 4 3 3  - -  Difficult doing work/chores Somewhat difficult Not difficult at all Not difficult at all - -    The patient has a history of falls. I did complete a risk assessment for falls. A plan of care for falls was documented.   Past Medical History:  Past Medical History:  Diagnosis Date   Allergy    Anxiety    GERD (gastroesophageal reflux disease)    Headache     Surgical History:  Past Surgical History:  Procedure Laterality Date   CESAREAN SECTION  1997   LAPAROTOMY Left 06/07/2008    Medications:  Current Outpatient Medications on File Prior to Visit  Medication Sig   acetaminophen (TYLENOL) 500 MG tablet Take  500 mg by mouth every 6 (six) hours as needed.   fluticasone (FLONASE) 50 MCG/ACT nasal spray Place 2 sprays into both nostrils daily.   levonorgestrel (MIRENA) 20 MCG/24HR IUD 1 each by Intrauterine route once.   loratadine (CLARITIN) 10 MG tablet Take 10 mg by mouth daily.   Pseudoephedrine HCl (SUDAFED PO) Take by mouth.   venlafaxine XR (EFFEXOR-XR) 75 MG 24 hr capsule Take 1 capsule (75 mg total) by mouth daily with breakfast.   vitamin B-12 (CYANOCOBALAMIN) 1000 MCG tablet Take 1,000 mcg by mouth daily.   No current facility-administered medications on file prior to visit.    Allergies:  Allergies  Allergen Reactions   Sulfa Antibiotics Other (See Comments)    Childhood allergy per patient    Social History:  Social History   Socioeconomic History   Marital status: Married    Spouse name: Not on file   Number of children: Not on file   Years of education: Not on file   Highest education level: Not on file  Occupational History   Not on file  Tobacco Use   Smoking status: Every Day  Packs/day: 1.00    Years: 20.00    Pack years: 20.00    Types: Cigarettes   Smokeless tobacco: Never  Vaping Use   Vaping Use: Former  Substance and Sexual Activity   Alcohol use: Yes    Comment: occasional   Drug use: No   Sexual activity: Yes    Birth control/protection: I.U.D.    Comment: mirena  Other Topics Concern   Not on file  Social History Narrative   Not on file   Social Determinants of Health   Financial Resource Strain: Not on file  Food Insecurity: Not on file  Transportation Needs: Not on file  Physical Activity: Not on file  Stress: Not on file  Social Connections: Not on file  Intimate Partner Violence: Not on file   Social History   Tobacco Use  Smoking Status Every Day   Packs/day: 1.00   Years: 20.00   Pack years: 20.00   Types: Cigarettes  Smokeless Tobacco Never   Social History   Substance and Sexual Activity  Alcohol Use Yes    Comment: occasional    Family History:  Family History  Problem Relation Age of Onset   COPD Mother    Heart disease Father    Cancer Father        Prostate   Autoimmune disease Sister    Breast cancer Paternal Aunt    COPD Maternal Grandmother    Diabetes Maternal Grandmother    Renal Disease Maternal Grandmother    Diabetes Maternal Grandfather    Heart disease Maternal Grandfather    Hypertension Paternal Grandmother    Stroke Paternal Grandmother    Breast cancer Paternal Grandmother    Heart disease Paternal Grandfather    Lung cancer Paternal Grandfather     Past medical history, surgical history, medications, allergies, family history and social history reviewed with patient today and changes made to appropriate areas of the chart.   Review of Systems  Eyes:  Negative for blurred vision and double vision.  Respiratory:  Negative for shortness of breath.   Cardiovascular:  Negative for chest pain, palpitations and leg swelling.  Neurological:  Negative for dizziness and headaches.  All other ROS negative except what is listed above and in the HPI.      Objective:    BP 108/69    Pulse 77    Temp 99 F (37.2 C) (Oral)    Ht 5' 3.2" (1.605 m)    Wt 144 lb 12.8 oz (65.7 kg)    LMP  (LMP Unknown)    SpO2 99%    BMI 25.49 kg/m   Wt Readings from Last 3 Encounters:  08/05/21 144 lb 12.8 oz (65.7 kg)  07/10/21 141 lb 9.6 oz (64.2 kg)  11/13/19 142 lb 6.4 oz (64.6 kg)    Physical Exam Vitals and nursing note reviewed.  Constitutional:      General: She is awake. She is not in acute distress.    Appearance: Normal appearance. She is well-developed and normal weight. She is not ill-appearing.  HENT:     Head: Normocephalic and atraumatic.     Right Ear: Hearing, tympanic membrane, ear canal and external ear normal. No drainage.     Left Ear: Hearing, tympanic membrane, ear canal and external ear normal. No drainage.     Nose: Nose normal.     Right Sinus: No  maxillary sinus tenderness or frontal sinus tenderness.     Left Sinus: No maxillary sinus tenderness  or frontal sinus tenderness.     Mouth/Throat:     Mouth: Mucous membranes are moist.     Pharynx: Oropharynx is clear. Uvula midline. No pharyngeal swelling, oropharyngeal exudate or posterior oropharyngeal erythema.  Eyes:     General: Lids are normal.        Right eye: No discharge.        Left eye: No discharge.     Extraocular Movements: Extraocular movements intact.     Conjunctiva/sclera: Conjunctivae normal.     Pupils: Pupils are equal, round, and reactive to light.     Visual Fields: Right eye visual fields normal and left eye visual fields normal.  Neck:     Thyroid: No thyromegaly.     Vascular: No carotid bruit.     Trachea: Trachea normal.  Cardiovascular:     Rate and Rhythm: Normal rate and regular rhythm.     Heart sounds: Normal heart sounds. No murmur heard.   No gallop.  Pulmonary:     Effort: Pulmonary effort is normal. No accessory muscle usage or respiratory distress.     Breath sounds: Normal breath sounds.  Chest:  Breasts:    Right: Normal.     Left: Normal.  Abdominal:     General: Bowel sounds are normal.     Palpations: Abdomen is soft. There is no hepatomegaly or splenomegaly.     Tenderness: There is no abdominal tenderness.  Musculoskeletal:        General: Normal range of motion.     Cervical back: Normal range of motion and neck supple.     Right lower leg: No edema.     Left lower leg: No edema.  Lymphadenopathy:     Head:     Right side of head: No submental, submandibular, tonsillar, preauricular or posterior auricular adenopathy.     Left side of head: No submental, submandibular, tonsillar, preauricular or posterior auricular adenopathy.     Cervical: No cervical adenopathy.     Upper Body:     Right upper body: No supraclavicular, axillary or pectoral adenopathy.     Left upper body: No supraclavicular, axillary or pectoral  adenopathy.  Skin:    General: Skin is warm and dry.     Capillary Refill: Capillary refill takes less than 2 seconds.     Findings: No rash.  Neurological:     Mental Status: She is alert and oriented to person, place, and time.     Gait: Gait is intact.     Deep Tendon Reflexes: Reflexes are normal and symmetric.     Reflex Scores:      Brachioradialis reflexes are 2+ on the right side and 2+ on the left side.      Patellar reflexes are 2+ on the right side and 2+ on the left side. Psychiatric:        Attention and Perception: Attention normal.        Mood and Affect: Mood normal.        Speech: Speech normal.        Behavior: Behavior normal. Behavior is cooperative.        Thought Content: Thought content normal.        Judgment: Judgment normal.    Results for orders placed or performed during the hospital encounter of 02/12/20  Acetylcholine Receptor Ab, All  Result Value Ref Range   Acety choline binding ab <0.03 0.00 - 0.24 nmol/L   Acetylchol Block Ab 15 0 -  25 %   Acetylcholine Modulat Ab <12 0 - 20 %      Assessment & Plan:   Problem List Items Addressed This Visit       Other   Anxiety    Chronic.  Controlled.  Continue with current medication regimen of Effexor 75mg  daily.  Labs ordered today.  Return to clinic in 6 months for reevaluation.  Call sooner if concerns arise.        Other Visit Diagnoses     Annual physical exam    -  Primary   Health maintanence reviewed during visit. Labs ordered. Mammogram ordered. Will return for PAP.   Relevant Orders   CBC with Differential/Platelet   Comprehensive metabolic panel   Lipid panel   TSH   Urinalysis, Routine w reflex microscopic   Encounter for screening mammogram for malignant neoplasm of breast       Relevant Orders   MM 3D SCREEN BREAST BILATERAL   Screening for ischemic heart disease            Follow up plan: Return if symptoms worsen or fail to improve.   LABORATORY TESTING:  - Pap  smear:  Will do at another visit  IMMUNIZATIONS:   - Tdap: Tetanus vaccination status reviewed: last tetanus booster within 10 years. - Influenza: Up to date - Pneumovax: Refused - Prevnar: Not applicable - COVID: Up to date - HPV: Not applicable - Shingrix vaccine: Not applicable  SCREENING: -Mammogram: Ordered today  - Colonoscopy: Not applicable  - Bone Density: Not applicable  -Hearing Test: Not applicable  -Spirometry: Not applicable   PATIENT COUNSELING:   Advised to take 1 mg of folate supplement per day if capable of pregnancy.   Sexuality: Discussed sexually transmitted diseases, partner selection, use of condoms, avoidance of unintended pregnancy  and contraceptive alternatives.   Advised to avoid cigarette smoking.  I discussed with the patient that most people either abstain from alcohol or drink within safe limits (<=14/week and <=4 drinks/occasion for males, <=7/weeks and <= 3 drinks/occasion for females) and that the risk for alcohol disorders and other health effects rises proportionally with the number of drinks per week and how often a drinker exceeds daily limits.  Discussed cessation/primary prevention of drug use and availability of treatment for abuse.   Diet: Encouraged to adjust caloric intake to maintain  or achieve ideal body weight, to reduce intake of dietary saturated fat and total fat, to limit sodium intake by avoiding high sodium foods and not adding table salt, and to maintain adequate dietary potassium and calcium preferably from fresh fruits, vegetables, and low-fat dairy products.    stressed the importance of regular exercise  Injury prevention: Discussed safety belts, safety helmets, smoke detector, smoking near bedding or upholstery.   Dental health: Discussed importance of regular tooth brushing, flossing, and dental visits.    NEXT PREVENTATIVE PHYSICAL DUE IN 1 YEAR. Return if symptoms worsen or fail to improve.

## 2021-08-05 NOTE — Patient Instructions (Signed)
Please call to schedule your mammogram and/or bone density: °[facility]  °Address: [address], [place], [place] [address]  °Phone: [phone] ° °

## 2021-08-05 NOTE — Assessment & Plan Note (Signed)
Chronic.  Controlled.  Continue with current medication regimen of Effexor 75mg  daily.  Labs ordered today.  Return to clinic in 6 months for reevaluation.  Call sooner if concerns arise.  ? ?

## 2021-08-05 NOTE — Progress Notes (Deleted)
? ?LMP  (LMP Unknown)   ? ?Subjective:  ? ? Patient ID: Erica Fowler, female    DOB: [birth year], 46 y.o.   MRN: [id number] ? ?HPI: ?Erica Fowler is a 46 y.o. female presenting on 08/05/2021 for comprehensive medical examination. Current medical complaints include:{Blank single:19197::"none","***"} ? ?She currently lives with: ?Menopausal Symptoms: {Blank single:19197::"yes","no"} ? ?Depression Screen done today and results listed below:  ?Depression screen [facility] 2/9 07/10/2021 11/13/2019 03/30/2019 02/16/2017 02/02/2017  ?Decreased Interest 0 0 0 0 0  ?Down, Depressed, Hopeless 0 0 0 0 0  ?PHQ - 2 Score 0 0 0 0 0  ?Altered sleeping 2 2 - - 0  ?Tired, decreased energy 1 1 - - 1  ?Change in appetite 0 0 - - 0  ?Feeling bad or failure about yourself  0 0 - - 0  ?Trouble concentrating 0 0 - - 0  ?Moving slowly or fidgety/restless 0 0 - - 0  ?Suicidal thoughts 0 0 - - -  ?PHQ-9 Score 3 3 - - 1  ?Difficult doing work/chores Not difficult at all Not difficult at all - - Not difficult at all  ? ? ?The patient {has/does not have:19849} a history of falls. I {did/did not:19850} complete a risk assessment for falls. A plan of care for falls {was/was not:19852} documented. ? ? ?Past Medical History:  ?Past Medical History:  ?Diagnosis Date  ? Allergy   ? Anxiety   ? GERD (gastroesophageal reflux disease)   ? Headache   ? ? ?Surgical History:  ?Past Surgical History:  ?Procedure Laterality Date  ? [place]  ? LAPAROTOMY Left 06/07/2008  ? ? ?Medications:  ?Current Outpatient Medications on File Prior to Visit  ?Medication Sig  ? acetaminophen (TYLENOL) 500 MG tablet Take 500 mg by mouth every 6 (six) hours as needed.  ? fluticasone (FLONASE) 50 MCG/ACT nasal spray Place 2 sprays into both nostrils daily.  ? levonorgestrel (MIRENA) 20 MCG/24HR IUD 1 each by Intrauterine route once.  ? Pseudoephedrine HCl (SUDAFED PO) Take by mouth.  ? venlafaxine XR (EFFEXOR-XR) 75 MG 24 hr capsule Take 1 capsule (75 mg total) by  mouth daily with breakfast.  ? vitamin B-12 (CYANOCOBALAMIN) 1000 MCG tablet Take 1,000 mcg by mouth daily.  ? ?No current facility-administered medications on file prior to visit.  ? ? ?Allergies:  ?Allergies  ?Allergen Reactions  ? Sulfa Antibiotics Other (See Comments)  ?  Childhood allergy per patient  ? ? ?Social History:  ?Social History  ? ?Socioeconomic History  ? Marital status: Married  ?  Spouse name: Not on file  ? Number of children: Not on file  ? Years of education: Not on file  ? Highest education level: Not on file  ?Occupational History  ? Not on file  ?Tobacco Use  ? Smoking status: Every Day  ?  Packs/day: 1.00  ?  Years: 20.00  ?  Pack years: 20.00  ?  Types: Cigarettes  ? Smokeless tobacco: Never  ?Vaping Use  ? Vaping Use: Former  ?Substance and Sexual Activity  ? Alcohol use: Yes  ?  Comment: occasional  ? Drug use: No  ? Sexual activity: Yes  ?  Birth control/protection: I.U.D.  ?  Comment: mirena  ?Other Topics Concern  ? Not on file  ?Social History Narrative  ? Not on file  ? ?Social Determinants of Health  ? ?Financial Resource Strain: Not on file  ?Food Insecurity: Not on file  ?Transportation Needs:  Not on file  ?Physical Activity: Not on file  ?Stress: Not on file  ?Social Connections: Not on file  ?Intimate Partner Violence: Not on file  ? ?Social History  ? ?Tobacco Use  ?Smoking Status Every Day  ? Packs/day: 1.00  ? Years: 20.00  ? Pack years: 20.00  ? Types: Cigarettes  ?Smokeless Tobacco Never  ? ?Social History  ? ?Substance and Sexual Activity  ?Alcohol Use Yes  ? Comment: occasional  ? ? ?Family History:  ?Family History  ?Problem Relation Age of Onset  ? COPD Mother   ? Heart disease Father   ? Cancer Father   ?     Prostate  ? Autoimmune disease Sister   ? Breast cancer Paternal Aunt   ? COPD Maternal Grandmother   ? Diabetes Maternal Grandmother   ? Renal Disease Maternal Grandmother   ? Diabetes Maternal Grandfather   ? Heart disease Maternal Grandfather   ? Hypertension  Paternal Grandmother   ? Stroke Paternal Grandmother   ? Breast cancer Paternal Grandmother   ? Heart disease Paternal Grandfather   ? Lung cancer Paternal Grandfather   ? ? ?Past medical history, surgical history, medications, allergies, family history and social history reviewed with patient today and changes made to appropriate areas of the chart.  ? ?ROS ?All other ROS negative except what is listed above and in the HPI.  ? ?   ?Objective:  ?  ?LMP  (LMP Unknown)   ?Wt Readings from Last 3 Encounters:  ?07/10/21 141 lb 9.6 oz (64.2 kg)  ?11/13/19 142 lb 6.4 oz (64.6 kg)  ?03/17/18 132 lb 3.2 oz (60 kg)  ?  ?Physical Exam ? ?Results for orders placed or performed during the hospital encounter of 02/12/20  ?Acetylcholine Receptor Ab, All  ?Result Value Ref Range  ? Acety choline binding ab <0.03 0.00 - 0.24 nmol/L  ? Acetylchol Block Ab 15 0 - 25 %  ? Acetylcholine Modulat Ab <12 0 - 20 %  ? ?   ?Assessment & Plan:  ? ?Problem List Items Addressed This Visit   ?None ?  ? ?Follow up plan: ?No follow-ups on file. ? ? ?LABORATORY TESTING:  ?- Pap smear: {Blank DGLOVF:64332::"RJJ done","not applicable","up to date","done elsewhere"} ? ?IMMUNIZATIONS:   ?- Tdap: Tetanus vaccination status reviewed: {tetanus status:315746}. ?- Influenza: {Blank single:19197::"Up to date","Administered today","Postponed to flu season","Refused","Given elsewhere"} ?- Pneumovax: {Blank single:19197::"Up to date","Administered today","Not applicable","Refused","Given elsewhere"} ?- Prevnar: {Blank single:19197::"Up to date","Administered today","Not applicable","Refused","Given elsewhere"} ?- COVID: {Blank single:19197::"Up to date","Administered today","Not applicable","Refused","Given elsewhere"} ?- HPV: {Blank single:19197::"Up to date","Administered today","Not applicable","Refused","Given elsewhere"} ?- Shingrix vaccine: {Blank single:19197::"Up to date","Administered today","Not applicable","Refused","Given  elsewhere"} ? ?SCREENING: ?-Mammogram: {Blank single:19197::"Up to date","Ordered today","Not applicable","Refused","Done elsewhere"}  ?- Colonoscopy: {Blank single:19197::"Up to date","Ordered today","Not applicable","Refused","Done elsewhere"}  ?- Bone Density: {Blank single:19197::"Up to date","Ordered today","Not applicable","Refused","Done elsewhere"}  ?-Hearing Test: {Blank single:19197::"Up to date","Ordered today","Not applicable","Refused","Done elsewhere"}  ?-Spirometry: {Blank single:19197::"Up to date","Ordered today","Not applicable","Refused","Done elsewhere"}  ? ?PATIENT COUNSELING:   ?Advised to take 1 mg of folate supplement per day if capable of pregnancy.  ? ?Sexuality: Discussed sexually transmitted diseases, partner selection, use of condoms, avoidance of unintended pregnancy  and contraceptive alternatives.  ? ?Advised to avoid cigarette smoking. ? ?I discussed with the patient that most people either abstain from alcohol or drink within safe limits (<=14/week and <=4 drinks/occasion for males, <=7/weeks and <= 3 drinks/occasion for females) and that the risk for alcohol disorders and other health effects rises proportionally with the  number of drinks per week and how often a drinker exceeds daily limits. ? ?Discussed cessation/primary prevention of drug use and availability of treatment for abuse.  ? ?Diet: Encouraged to adjust caloric intake to maintain  or achieve ideal body weight, to reduce intake of dietary saturated fat and total fat, to limit sodium intake by avoiding high sodium foods and not adding table salt, and to maintain adequate dietary potassium and calcium preferably from fresh fruits, vegetables, and low-fat dairy products.   ? ?stressed the importance of regular exercise ? ?Injury prevention: Discussed safety belts, safety helmets, smoke detector, smoking near bedding or upholstery.  ? ?Dental health: Discussed importance of regular tooth brushing, flossing, and dental  visits.  ? ? ?NEXT PREVENTATIVE PHYSICAL DUE IN 1 YEAR. ?No follow-ups on file. ? ? ? ? ? ? ? ? ? ?

## 2021-08-06 LAB — TSH: TSH: 1.04 u[IU]/mL (ref 0.450–4.500)

## 2021-08-06 LAB — LIPID PANEL
Chol/HDL Ratio: 3.3 ratio (ref 0.0–4.4)
Cholesterol, Total: 214 mg/dL — ABNORMAL HIGH (ref 100–199)
HDL: 65 mg/dL (ref >39)
LDL Chol Calc (NIH): 141 mg/dL — ABNORMAL HIGH (ref 0–99)
Triglycerides: 48 mg/dL (ref 0–149)
VLDL Cholesterol Cal: 8 mg/dL (ref 5–40)

## 2021-08-06 LAB — CBC WITH DIFFERENTIAL/PLATELET
Basophils Absolute: 0.1 10*3/uL (ref 0.0–0.2)
Basos: 1 %
EOS (ABSOLUTE): 0.1 10*3/uL (ref 0.0–0.4)
Eos: 1 %
Hematocrit: 45.7 % (ref 34.0–46.6)
Hemoglobin: 15.3 g/dL (ref 11.1–15.9)
Immature Grans (Abs): 0 10*3/uL (ref 0.0–0.1)
Immature Granulocytes: 0 %
Lymphocytes Absolute: 1.9 10*3/uL (ref 0.7–3.1)
Lymphs: 25 %
MCH: 29.9 pg (ref 26.6–33.0)
MCHC: 33.5 g/dL (ref 31.5–35.7)
MCV: 89 fL (ref 79–97)
Monocytes Absolute: 0.6 10*3/uL (ref 0.1–0.9)
Monocytes: 9 %
Neutrophils Absolute: 4.8 10*3/uL (ref 1.4–7.0)
Neutrophils: 64 %
Platelets: 288 10*3/uL (ref 150–450)
RBC: 5.11 x10E6/uL (ref 3.77–5.28)
RDW: 12.4 % (ref 11.7–15.4)
WBC: 7.4 10*3/uL (ref 3.4–10.8)

## 2021-08-06 LAB — COMPREHENSIVE METABOLIC PANEL
ALT: 19 IU/L (ref 0–32)
AST: 23 IU/L (ref 0–40)
Albumin/Globulin Ratio: 1.8 (ref 1.2–2.2)
Albumin: 4.7 g/dL (ref 3.8–4.8)
Alkaline Phosphatase: 47 IU/L (ref 44–121)
BUN/Creatinine Ratio: 14 (ref 9–23)
BUN: 10 mg/dL (ref 6–24)
Bilirubin Total: 0.3 mg/dL (ref 0.0–1.2)
CO2: 23 mmol/L (ref 20–29)
Calcium: 9.4 mg/dL (ref 8.7–10.2)
Chloride: 102 mmol/L (ref 96–106)
Creatinine, Ser: 0.73 mg/dL (ref 0.57–1.00)
Globulin, Total: 2.6 g/dL (ref 1.5–4.5)
Glucose: 102 mg/dL — ABNORMAL HIGH (ref 70–99)
Potassium: 4.4 mmol/L (ref 3.5–5.2)
Sodium: 137 mmol/L (ref 134–144)
Total Protein: 7.3 g/dL (ref 6.0–8.5)
eGFR: 104 mL/min/{1.73_m2} (ref >59)

## 2021-08-06 NOTE — Progress Notes (Signed)
Hi Pam. It was nice to see you yesterday. Your lab work shows that your cholesterol is elevated.  I recommend following a low fat diet and exercising.  Your other lab work looks good.  No concerns at this time. Follow up as discussed.  ?

## 2021-08-28 ENCOUNTER — Encounter: Payer: No Typology Code available for payment source | Admitting: Nurse Practitioner

## 2021-08-28 NOTE — Progress Notes (Deleted)
There were no vitals taken for this visit.   Subjective:    Patient ID: Erica Fowler, female    DOB: Dec 26, 1975, 46 y.o.   MRN: [id number]  HPI: Erica Fowler is a 46 y.o. female presenting on 08/28/2021 for comprehensive medical examination. Current medical complaints include:{Blank single:19197::"none","***"}  She currently lives with: Menopausal Symptoms: {Blank single:19197::"yes","no"}  ANXIETY  Depression Screen done today and results listed below:     08/05/2021    8:46 AM 07/10/2021    1:27 PM 11/13/2019    2:18 PM 03/30/2019    8:45 AM 02/16/2017    3:03 PM  Depression screen PHQ 2/9  Decreased Interest 1 0 0 0 0  Down, Depressed, Hopeless 0 0 0 0 0  PHQ - 2 Score 1 0 0 0 0  Altered sleeping 2 2 2     Tired, decreased energy 1 1 1     Change in appetite 0 0 0    Feeling bad or failure about yourself  0 0 0    Trouble concentrating 0 0 0    Moving slowly or fidgety/restless 0 0 0    Suicidal thoughts 0 0 0    PHQ-9 Score 4 3 3     Difficult doing work/chores Somewhat difficult Not difficult at all Not difficult at all      The patient {has/does not have:19849} a history of falls. I {did/did not:19850} complete a risk assessment for falls. A plan of care for falls {was/was not:19852} documented.   Past Medical History:  Past Medical History:  Diagnosis Date   Allergy    Anxiety    GERD (gastroesophageal reflux disease)    Headache     Surgical History:  Past Surgical History:  Procedure Laterality Date   CESAREAN SECTION  1997   LAPAROTOMY Left 06/07/2008    Medications:  Current Outpatient Medications on File Prior to Visit  Medication Sig   acetaminophen (TYLENOL) 500 MG tablet Take 500 mg by mouth every 6 (six) hours as needed.   fluticasone (FLONASE) 50 MCG/ACT nasal spray Place 2 sprays into both nostrils daily.   levonorgestrel (MIRENA) 20 MCG/24HR IUD 1 each by Intrauterine route once.   loratadine (CLARITIN) 10 MG tablet Take 10 mg by mouth  daily.   Pseudoephedrine HCl (SUDAFED PO) Take by mouth.   venlafaxine XR (EFFEXOR-XR) 75 MG 24 hr capsule Take 1 capsule (75 mg total) by mouth daily with breakfast.   vitamin B-12 (CYANOCOBALAMIN) 1000 MCG tablet Take 1,000 mcg by mouth daily.   No current facility-administered medications on file prior to visit.    Allergies:  Allergies  Allergen Reactions   Sulfa Antibiotics Other (See Comments)    Childhood allergy per patient    Social History:  Social History   Socioeconomic History   Marital status: Married    Spouse name: Not on file   Number of children: Not on file   Years of education: Not on file   Highest education level: Not on file  Occupational History   Not on file  Tobacco Use   Smoking status: Every Day    Packs/day: 1.00    Years: 20.00    Pack years: 20.00    Types: Cigarettes   Smokeless tobacco: Never  Vaping Use   Vaping Use: Former  Substance and Sexual Activity   Alcohol use: Yes    Comment: occasional   Drug use: No   Sexual activity: Yes    Birth control/protection: I.U.D.  Comment: mirena  Other Topics Concern   Not on file  Social History Narrative   Not on file   Social Determinants of Health   Financial Resource Strain: Not on file  Food Insecurity: Not on file  Transportation Needs: Not on file  Physical Activity: Not on file  Stress: Not on file  Social Connections: Not on file  Intimate Partner Violence: Not on file   Social History   Tobacco Use  Smoking Status Every Day   Packs/day: 1.00   Years: 20.00   Pack years: 20.00   Types: Cigarettes  Smokeless Tobacco Never   Social History   Substance and Sexual Activity  Alcohol Use Yes   Comment: occasional    Family History:  Family History  Problem Relation Age of Onset   COPD Mother    Heart disease Father    Cancer Father        Prostate   Autoimmune disease Sister    Breast cancer Paternal Aunt    COPD Maternal Grandmother    Diabetes Maternal  Grandmother    Renal Disease Maternal Grandmother    Diabetes Maternal Grandfather    Heart disease Maternal Grandfather    Hypertension Paternal Grandmother    Stroke Paternal Grandmother    Breast cancer Paternal Grandmother    Heart disease Paternal Grandfather    Lung cancer Paternal Grandfather     Past medical history, surgical history, medications, allergies, family history and social history reviewed with patient today and changes made to appropriate areas of the chart.   ROS All other ROS negative except what is listed above and in the HPI.      Objective:    There were no vitals taken for this visit.  Wt Readings from Last 3 Encounters:  08/05/21 144 lb 12.8 oz (65.7 kg)  07/10/21 141 lb 9.6 oz (64.2 kg)  11/13/19 142 lb 6.4 oz (64.6 kg)    Physical Exam  Results for orders placed or performed in visit on 08/05/21  CBC with Differential/Platelet  Result Value Ref Range   WBC 7.4 3.4 - 10.8 x10E3/uL   RBC 5.11 3.77 - 5.28 x10E6/uL   Hemoglobin 15.3 11.1 - 15.9 g/dL   Hematocrit 96.2 95.2 - 46.6 %   MCV 89 79 - 97 fL   MCH 29.9 26.6 - 33.0 pg   MCHC 33.5 31.5 - 35.7 g/dL   RDW 84.1 32.4 - 40.1 %   Platelets 288 150 - 450 x10E3/uL   Neutrophils 64 Not Estab. %   Lymphs 25 Not Estab. %   Monocytes 9 Not Estab. %   Eos 1 Not Estab. %   Basos 1 Not Estab. %   Neutrophils Absolute 4.8 1.4 - 7.0 x10E3/uL   Lymphocytes Absolute 1.9 0.7 - 3.1 x10E3/uL   Monocytes Absolute 0.6 0.1 - 0.9 x10E3/uL   EOS (ABSOLUTE) 0.1 0.0 - 0.4 x10E3/uL   Basophils Absolute 0.1 0.0 - 0.2 x10E3/uL   Immature Granulocytes 0 Not Estab. %   Immature Grans (Abs) 0.0 0.0 - 0.1 x10E3/uL  Comprehensive metabolic panel  Result Value Ref Range   Glucose 102 (H) 70 - 99 mg/dL   BUN 10 6 - 24 mg/dL   Creatinine, Ser 0.27 0.57 - 1.00 mg/dL   eGFR 253 >66 YQ/IHK/7.42   BUN/Creatinine Ratio 14 9 - 23   Sodium 137 134 - 144 mmol/L   Potassium 4.4 3.5 - 5.2 mmol/L   Chloride 102 96 - 106  mmol/L  CO2 23 20 - 29 mmol/L   Calcium 9.4 8.7 - 10.2 mg/dL   Total Protein 7.3 6.0 - 8.5 g/dL   Albumin 4.7 3.8 - 4.8 g/dL   Globulin, Total 2.6 1.5 - 4.5 g/dL   Albumin/Globulin Ratio 1.8 1.2 - 2.2   Bilirubin Total 0.3 0.0 - 1.2 mg/dL   Alkaline Phosphatase 47 44 - 121 IU/L   AST 23 0 - 40 IU/L   ALT 19 0 - 32 IU/L  Lipid panel  Result Value Ref Range   Cholesterol, Total 214 (H) 100 - 199 mg/dL   Triglycerides 48 0 - 149 mg/dL   HDL 65 >16 mg/dL   VLDL Cholesterol Cal 8 5 - 40 mg/dL   LDL Chol Calc (NIH) 109 (H) 0 - 99 mg/dL   Chol/HDL Ratio 3.3 0.0 - 4.4 ratio  TSH  Result Value Ref Range   TSH 1.040 0.450 - 4.500 uIU/mL  Urinalysis, Routine w reflex microscopic  Result Value Ref Range   Specific Gravity, UA 1.015 1.005 - 1.030   pH, UA 7.0 5.0 - 7.5   Color, UA Yellow Yellow   Appearance Ur Clear Clear   Leukocytes,UA Negative Negative   Protein,UA Negative Negative/Trace   Glucose, UA Negative Negative   Ketones, UA Negative Negative   RBC, UA Negative Negative   Bilirubin, UA Negative Negative   Urobilinogen, Ur 0.2 0.2 - 1.0 mg/dL   Nitrite, UA Negative Negative      Assessment & Plan:   Problem List Items Addressed This Visit       Other   Anxiety   Other Visit Diagnoses     Annual physical exam    -  Primary        Follow up plan: No follow-ups on file.   LABORATORY TESTING:  - Pap smear: {Blank single:19197::"pap done","not applicable","up to date","done elsewhere"}  IMMUNIZATIONS:   - Tdap: Tetanus vaccination status reviewed: {tetanus status:315746}. - Influenza: {Blank single:19197::"Up to date","Administered today","Postponed to flu season","Refused","Given elsewhere"} - Pneumovax: {Blank single:19197::"Up to date","Administered today","Not applicable","Refused","Given elsewhere"} - Prevnar: {Blank single:19197::"Up to date","Administered today","Not applicable","Refused","Given elsewhere"} - COVID: {Blank single:19197::"Up to  date","Administered today","Not applicable","Refused","Given elsewhere"} - HPV: {Blank single:19197::"Up to date","Administered today","Not applicable","Refused","Given elsewhere"} - Shingrix vaccine: {Blank single:19197::"Up to date","Administered today","Not applicable","Refused","Given elsewhere"}  SCREENING: -Mammogram: {Blank single:19197::"Up to date","Ordered today","Not applicable","Refused","Done elsewhere"}  - Colonoscopy: {Blank single:19197::"Up to date","Ordered today","Not applicable","Refused","Done elsewhere"}  - Bone Density: {Blank single:19197::"Up to date","Ordered today","Not applicable","Refused","Done elsewhere"}  -Hearing Test: {Blank single:19197::"Up to date","Ordered today","Not applicable","Refused","Done elsewhere"}  -Spirometry: {Blank single:19197::"Up to date","Ordered today","Not applicable","Refused","Done elsewhere"}   PATIENT COUNSELING:   Advised to take 1 mg of folate supplement per day if capable of pregnancy.   Sexuality: Discussed sexually transmitted diseases, partner selection, use of condoms, avoidance of unintended pregnancy  and contraceptive alternatives.   Advised to avoid cigarette smoking.  I discussed with the patient that most people either abstain from alcohol or drink within safe limits (<=14/week and <=4 drinks/occasion for males, <=7/weeks and <= 3 drinks/occasion for females) and that the risk for alcohol disorders and other health effects rises proportionally with the number of drinks per week and how often a drinker exceeds daily limits.  Discussed cessation/primary prevention of drug use and availability of treatment for abuse.   Diet: Encouraged to adjust caloric intake to maintain  or achieve ideal body weight, to reduce intake of dietary saturated fat and total fat, to limit sodium intake by avoiding high sodium foods and not adding table  salt, and to maintain adequate dietary potassium and calcium preferably from fresh fruits,  vegetables, and low-fat dairy products.    stressed the importance of regular exercise  Injury prevention: Discussed safety belts, safety helmets, smoke detector, smoking near bedding or upholstery.   Dental health: Discussed importance of regular tooth brushing, flossing, and dental visits.    NEXT PREVENTATIVE PHYSICAL DUE IN 1 YEAR. No follow-ups on file.

## 2021-10-09 ENCOUNTER — Other Ambulatory Visit: Payer: Self-pay

## 2021-10-09 MED FILL — Venlafaxine HCl Cap ER 24HR 75 MG (Base Equivalent): ORAL | 90 days supply | Qty: 90 | Fill #1 | Status: AC

## 2021-10-16 ENCOUNTER — Other Ambulatory Visit (HOSPITAL_COMMUNITY)
Admission: RE | Admit: 2021-10-16 | Discharge: 2021-10-16 | Disposition: A | Discharge status: Home / Self Care | Payer: No Typology Code available for payment source | Source: Ambulatory Visit | Attending: Nurse Practitioner | Admitting: Nurse Practitioner

## 2021-10-16 ENCOUNTER — Other Ambulatory Visit: Payer: Self-pay

## 2021-10-16 ENCOUNTER — Ambulatory Visit (INDEPENDENT_AMBULATORY_CARE_PROVIDER_SITE_OTHER): Payer: No Typology Code available for payment source | Admitting: Nurse Practitioner

## 2021-10-16 ENCOUNTER — Encounter: Payer: Self-pay | Admitting: Nurse Practitioner

## 2021-10-16 VITALS — BP 123/77 | HR 84 | Temp 98.4°F | Ht 63.0 in | Wt 141.8 lb

## 2021-10-16 DIAGNOSIS — Z0000 Encounter for general adult medical examination without abnormal findings: Secondary | ICD-10-CM | POA: Insufficient documentation

## 2021-10-16 DIAGNOSIS — F439 Reaction to severe stress, unspecified: Secondary | ICD-10-CM

## 2021-10-16 DIAGNOSIS — R42 Dizziness and giddiness: Secondary | ICD-10-CM | POA: Diagnosis not present

## 2021-10-16 DIAGNOSIS — D229 Melanocytic nevi, unspecified: Secondary | ICD-10-CM | POA: Diagnosis not present

## 2021-10-16 DIAGNOSIS — F419 Anxiety disorder, unspecified: Secondary | ICD-10-CM | POA: Diagnosis not present

## 2021-10-16 DIAGNOSIS — R8290 Unspecified abnormal findings in urine: Secondary | ICD-10-CM

## 2021-10-16 DIAGNOSIS — J301 Allergic rhinitis due to pollen: Secondary | ICD-10-CM

## 2021-10-16 DIAGNOSIS — R7309 Other abnormal glucose: Secondary | ICD-10-CM

## 2021-10-16 LAB — URINALYSIS, ROUTINE W REFLEX MICROSCOPIC
Bilirubin, UA: NEGATIVE
Glucose, UA: NEGATIVE
Ketones, UA: NEGATIVE
Leukocytes,UA: NEGATIVE
Nitrite, UA: POSITIVE — AB
Protein,UA: NEGATIVE
Specific Gravity, UA: 1.025 (ref 1.005–1.030)
Urobilinogen, Ur: 0.2 mg/dL (ref 0.2–1.0)
pH, UA: 6.5 (ref 5.0–7.5)

## 2021-10-16 LAB — MICROSCOPIC EXAMINATION: WBC, UA: NONE SEEN /hpf (ref 0–5)

## 2021-10-16 MED ORDER — VENLAFAXINE HCL ER 75 MG PO CP24
75.0000 mg | ORAL_CAPSULE | Freq: Every day | ORAL | 1 refills | Status: DC
  Filled 2021-10-16 – 2021-12-31 (×2): qty 90, 90d supply, fill #0
  Filled 2022-04-12: qty 90, 90d supply, fill #1

## 2021-10-16 MED ORDER — FLUTICASONE PROPIONATE 50 MCG/ACT NA SUSP
2.0000 | Freq: Every day | NASAL | 6 refills | Status: DC
  Filled 2021-10-16: qty 16, 30d supply, fill #0
  Filled 2022-10-05: qty 16, 30d supply, fill #1

## 2021-10-16 NOTE — Addendum Note (Signed)
Addended by: Jon Billings on: 10/16/2021 03:20 PM ? ? Modules accepted: Orders ? ?

## 2021-10-16 NOTE — Assessment & Plan Note (Signed)
Chronic.  Controlled.  Continue with current medication regimen of Effexor '75mg'$  daily.  She would like to hold off on increasing dose.  Does not want to add medication for sleep disturbance at this time. Labs ordered today.  Return to clinic in 6 months for reevaluation.  Call sooner if concerns arise.  ? ?

## 2021-10-16 NOTE — Progress Notes (Signed)
? ?BP 123/77   Pulse 84   Temp 98.4 ?F (36.9 ?C) (Oral)   Ht '5\' 3"'$  (1.6 m)   Wt 141 lb 12.8 oz (64.3 kg)   SpO2 99%   BMI 25.12 kg/m?   ? ?Subjective:  ? ? Patient ID: Erica Fowler, female    DOB: 1976-04-25, 46 y.o.   MRN: [id number] ? ?HPI: ?DANDREA Fowler is a 46 y.o. female presenting on 10/16/2021 for comprehensive medical examination. Current medical complaints include: dizziness ? ?She currently lives with: ?Menopausal Symptoms: no ? ? ?Patient states she has been having dizziness that was intermittent but now it seems to be worse.  States when she walks down the stairs she feels like she is going to tumble down the stairs.  She has lost control of her balance.  These symptoms have been going on since the summer of 2021. She doesn't notice a pattern.  ? ?She has been waking up with night sweats.  She is having some numbness and tingling in her right arm.  ? ?Patient has had two episodes in the last year where she has lost control of her bladder.  ? ?ANXIETY ?Patient states she hasn't been sleeping well.  No sure if the Effexor is working for her but she is not ready to increase.  Feels like if she can get some sleep then it will improve.  ? ?Depression Screen done today and results listed below:  ? ?  08/05/2021  ?  8:46 AM 07/10/2021  ?  1:27 PM 11/13/2019  ?  2:18 PM 03/30/2019  ?  8:45 AM 02/16/2017  ?  3:03 PM  ?Depression screen PHQ 2/9  ?Decreased Interest 1 0 0 0 0  ?Down, Depressed, Hopeless 0 0 0 0 0  ?PHQ - 2 Score 1 0 0 0 0  ?Altered sleeping '2 2 2    '$ ?Tired, decreased energy '1 1 1    '$ ?Change in appetite 0 0 0    ?Feeling bad or failure about yourself  0 0 0    ?Trouble concentrating 0 0 0    ?Moving slowly or fidgety/restless 0 0 0    ?Suicidal thoughts 0 0 0    ?PHQ-9 Score '4 3 3    '$ ?Difficult doing work/chores Somewhat difficult Not difficult at all Not difficult at all    ? ? ?The patient does not have a history of falls. I did complete a risk assessment for falls. A plan of care for falls  was documented. ? ? ?Past Medical History:  ?Past Medical History:  ?Diagnosis Date  ? Allergy   ? Anxiety   ? GERD (gastroesophageal reflux disease)   ? Headache   ? ? ?Surgical History:  ?Past Surgical History:  ?Procedure Laterality Date  ? [place]  ? LAPAROTOMY Left 06/07/2008  ? ? ?Medications:  ?Current Outpatient Medications on File Prior to Visit  ?Medication Sig  ? acetaminophen (TYLENOL) 500 MG tablet Take 500 mg by mouth every 6 (six) hours as needed.  ? fexofenadine (ALLEGRA) 180 MG tablet   ? levonorgestrel (MIRENA) 20 MCG/24HR IUD 1 each by Intrauterine route once.  ? Pseudoephedrine HCl (SUDAFED PO) Take by mouth.  ? vitamin B-12 (CYANOCOBALAMIN) 1000 MCG tablet Take 1,000 mcg by mouth daily.  ? ?No current facility-administered medications on file prior to visit.  ? ? ?Allergies:  ?Allergies  ?Allergen Reactions  ? Sulfa Antibiotics Other (See Comments)  ?  Childhood allergy per patient  ? ? ?  Social History:  ?Social History  ? ?Socioeconomic History  ? Marital status: Married  ?  Spouse name: Not on file  ? Number of children: Not on file  ? Years of education: Not on file  ? Highest education level: Not on file  ?Occupational History  ? Not on file  ?Tobacco Use  ? Smoking status: Every Day  ?  Packs/day: 1.00  ?  Years: 20.00  ?  Pack years: 20.00  ?  Types: Cigarettes  ? Smokeless tobacco: Never  ?Vaping Use  ? Vaping Use: Former  ?Substance and Sexual Activity  ? Alcohol use: Yes  ?  Comment: occasional  ? Drug use: No  ? Sexual activity: Yes  ?  Birth control/protection: I.U.D.  ?  Comment: mirena  ?Other Topics Concern  ? Not on file  ?Social History Narrative  ? Not on file  ? ?Social Determinants of Health  ? ?Financial Resource Strain: Not on file  ?Food Insecurity: Not on file  ?Transportation Needs: Not on file  ?Physical Activity: Not on file  ?Stress: Not on file  ?Social Connections: Not on file  ?Intimate Partner Violence: Not on file  ? ?Social History  ? ?Tobacco Use   ?Smoking Status Every Day  ? Packs/day: 1.00  ? Years: 20.00  ? Pack years: 20.00  ? Types: Cigarettes  ?Smokeless Tobacco Never  ? ?Social History  ? ?Substance and Sexual Activity  ?Alcohol Use Yes  ? Comment: occasional  ? ? ?Family History:  ?Family History  ?Problem Relation Age of Onset  ? COPD Mother   ? Heart disease Father   ? Cancer Father   ?     Prostate  ? Autoimmune disease Sister   ? Breast cancer Paternal Aunt   ? COPD Maternal Grandmother   ? Diabetes Maternal Grandmother   ? Renal Disease Maternal Grandmother   ? Diabetes Maternal Grandfather   ? Heart disease Maternal Grandfather   ? Hypertension Paternal Grandmother   ? Stroke Paternal Grandmother   ? Breast cancer Paternal Grandmother   ? Heart disease Paternal Grandfather   ? Lung cancer Paternal Grandfather   ? ? ?Past medical history, surgical history, medications, allergies, family history and social history reviewed with patient today and changes made to appropriate areas of the chart.  ? ?Review of Systems  ?Neurological:  Positive for dizziness.  ?Psychiatric/Behavioral:  Negative for depression. The patient is nervous/anxious and has insomnia.   ?All other ROS negative except what is listed above and in the HPI.  ? ?   ?Objective:  ?  ?BP 123/77   Pulse 84   Temp 98.4 ?F (36.9 ?C) (Oral)   Ht '5\' 3"'$  (1.6 m)   Wt 141 lb 12.8 oz (64.3 kg)   SpO2 99%   BMI 25.12 kg/m?   ?Wt Readings from Last 3 Encounters:  ?10/16/21 141 lb 12.8 oz (64.3 kg)  ?08/05/21 144 lb 12.8 oz (65.7 kg)  ?07/10/21 141 lb 9.6 oz (64.2 kg)  ?  ?Physical Exam ?Vitals and nursing note reviewed.  ?Constitutional:   ?   General: She is awake. She is not in acute distress. ?   Appearance: Normal appearance. She is well-developed and normal weight. She is not ill-appearing.  ?HENT:  ?   Head: Normocephalic and atraumatic.  ?   Right Ear: Hearing, tympanic membrane, ear canal and external ear normal. No drainage.  ?   Left Ear: Hearing, tympanic membrane, ear canal and  external  ear normal. No drainage.  ?   Nose: Nose normal.  ?   Right Sinus: No maxillary sinus tenderness or frontal sinus tenderness.  ?   Left Sinus: No maxillary sinus tenderness or frontal sinus tenderness.  ?   Mouth/Throat:  ?   Mouth: Mucous membranes are moist.  ?   Pharynx: Oropharynx is clear. Uvula midline. No pharyngeal swelling, oropharyngeal exudate or posterior oropharyngeal erythema.  ?Eyes:  ?   General: Lids are normal.     ?   Right eye: No discharge.     ?   Left eye: No discharge.  ?   Extraocular Movements: Extraocular movements intact.  ?   Conjunctiva/sclera: Conjunctivae normal.  ?   Pupils: Pupils are equal, round, and reactive to light.  ?   Visual Fields: Right eye visual fields normal and left eye visual fields normal.  ?Neck:  ?   Thyroid: No thyromegaly.  ?   Vascular: No carotid bruit.  ?   Trachea: Trachea normal.  ?Cardiovascular:  ?   Rate and Rhythm: Normal rate and regular rhythm.  ?   Heart sounds: Normal heart sounds. No murmur heard. ?  No gallop.  ?Pulmonary:  ?   Effort: Pulmonary effort is normal. No accessory muscle usage or respiratory distress.  ?   Breath sounds: Normal breath sounds.  ?Chest:  ?Breasts: ?   Right: Normal.  ?   Left: Normal.  ?Abdominal:  ?   General: Bowel sounds are normal.  ?   Palpations: Abdomen is soft. There is no hepatomegaly or splenomegaly.  ?   Tenderness: There is no abdominal tenderness.  ?Musculoskeletal:     ?   General: Normal range of motion.  ?   Cervical back: Normal range of motion and neck supple.  ?   Right lower leg: No edema.  ?   Left lower leg: No edema.  ?Lymphadenopathy:  ?   Head:  ?   Right side of head: No submental, submandibular, tonsillar, preauricular or posterior auricular adenopathy.  ?   Left side of head: No submental, submandibular, tonsillar, preauricular or posterior auricular adenopathy.  ?   Cervical: No cervical adenopathy.  ?   Upper Body:  ?   Right upper body: No supraclavicular, axillary or pectoral  adenopathy.  ?   Left upper body: No supraclavicular, axillary or pectoral adenopathy.  ?Skin: ?   General: Skin is warm and dry.  ?   Capillary Refill: Capillary refill takes less than 2 seconds.  ?   Findings: No ra

## 2021-10-16 NOTE — Patient Instructions (Signed)
Ashwagnda and Magnesium ?

## 2021-10-17 LAB — CBC WITH DIFFERENTIAL/PLATELET
Basophils Absolute: 0.1 10*3/uL (ref 0.0–0.2)
Basos: 1 %
EOS (ABSOLUTE): 0.2 10*3/uL (ref 0.0–0.4)
Eos: 2 %
Hematocrit: 43.8 % (ref 34.0–46.6)
Hemoglobin: 14.7 g/dL (ref 11.1–15.9)
Immature Grans (Abs): 0 10*3/uL (ref 0.0–0.1)
Immature Granulocytes: 0 %
Lymphocytes Absolute: 2.5 10*3/uL (ref 0.7–3.1)
Lymphs: 29 %
MCH: 29.9 pg (ref 26.6–33.0)
MCHC: 33.6 g/dL (ref 31.5–35.7)
MCV: 89 fL (ref 79–97)
Monocytes Absolute: 0.8 10*3/uL (ref 0.1–0.9)
Monocytes: 10 %
Neutrophils Absolute: 4.9 10*3/uL (ref 1.4–7.0)
Neutrophils: 58 %
Platelets: 279 10*3/uL (ref 150–450)
RBC: 4.92 x10E6/uL (ref 3.77–5.28)
RDW: 12.8 % (ref 11.7–15.4)
WBC: 8.5 10*3/uL (ref 3.4–10.8)

## 2021-10-17 LAB — LIPID PANEL
Chol/HDL Ratio: 3.3 ratio (ref 0.0–4.4)
Cholesterol, Total: 200 mg/dL — ABNORMAL HIGH (ref 100–199)
HDL: 61 mg/dL (ref >39)
LDL Chol Calc (NIH): 126 mg/dL — ABNORMAL HIGH (ref 0–99)
Triglycerides: 70 mg/dL (ref 0–149)
VLDL Cholesterol Cal: 13 mg/dL (ref 5–40)

## 2021-10-17 LAB — COMPREHENSIVE METABOLIC PANEL
ALT: 24 IU/L (ref 0–32)
AST: 25 IU/L (ref 0–40)
Albumin/Globulin Ratio: 1.9 (ref 1.2–2.2)
Albumin: 4.5 g/dL (ref 3.8–4.8)
Alkaline Phosphatase: 46 IU/L (ref 44–121)
BUN/Creatinine Ratio: 16 (ref 9–23)
BUN: 12 mg/dL (ref 6–24)
Bilirubin Total: 0.2 mg/dL (ref 0.0–1.2)
CO2: 23 mmol/L (ref 20–29)
Calcium: 9.2 mg/dL (ref 8.7–10.2)
Chloride: 102 mmol/L (ref 96–106)
Creatinine, Ser: 0.74 mg/dL (ref 0.57–1.00)
Globulin, Total: 2.4 g/dL (ref 1.5–4.5)
Glucose: 112 mg/dL — ABNORMAL HIGH (ref 70–99)
Potassium: 3.8 mmol/L (ref 3.5–5.2)
Sodium: 139 mmol/L (ref 134–144)
Total Protein: 6.9 g/dL (ref 6.0–8.5)
eGFR: 102 mL/min/{1.73_m2} (ref >59)

## 2021-10-17 LAB — VITAMIN D 25 HYDROXY (VIT D DEFICIENCY, FRACTURES): Vit D, 25-Hydroxy: 20.8 ng/mL — ABNORMAL LOW (ref 30.0–100.0)

## 2021-10-17 LAB — TSH: TSH: 1.26 u[IU]/mL (ref 0.450–4.500)

## 2021-10-19 ENCOUNTER — Other Ambulatory Visit: Payer: Self-pay

## 2021-10-19 ENCOUNTER — Encounter: Payer: Self-pay | Admitting: Nurse Practitioner

## 2021-10-19 DIAGNOSIS — R7309 Other abnormal glucose: Secondary | ICD-10-CM

## 2021-10-19 MED ORDER — VITAMIN D (ERGOCALCIFEROL) 1.25 MG (50000 UNIT) PO CAPS
50000.0000 [IU] | ORAL_CAPSULE | ORAL | 1 refills | Status: DC
  Filled 2021-10-19: qty 12, 84d supply, fill #0
  Filled 2021-12-31: qty 12, 84d supply, fill #1

## 2021-10-19 MED ORDER — NITROFURANTOIN MONOHYD MACRO 100 MG PO CAPS
100.0000 mg | ORAL_CAPSULE | Freq: Two times a day (BID) | ORAL | 0 refills | Status: DC
  Filled 2021-10-19: qty 10, 5d supply, fill #0

## 2021-10-19 NOTE — Addendum Note (Signed)
Addended by: Jon Billings on: 10/19/2021 08:27 AM ? ? Modules accepted: Orders ? ?

## 2021-10-19 NOTE — Progress Notes (Signed)
Hi Erica Fowler.  Your urine did grow E.Coli.  This is likely the cause of your most recent episode of incontinence.  I have sent you in Macrobid BID for 5 days.  Please let me know if you have any questions.

## 2021-10-19 NOTE — Addendum Note (Signed)
Addended by: Jon Billings on: 10/19/2021 02:13 PM ? ? Modules accepted: Orders ? ?

## 2021-10-19 NOTE — Progress Notes (Signed)
Hi Pam. It was nice to see you last week.  Your lab work shows that your glucose is elevated.  I would like you to come back and check an A1c to make sure you are not diabetic. Please make a lab appt.   ? ?Your cholesterol is mildly elevated.  I calculated your cardiac risk score and your are in the low category.  I recommend decreasing the fat in your diet. We will continue to check this at future visits. ? ?Your Vitamin D is low.  I recommend starting 50,000 IU once weekly to help with this.  This is a prescription. If you agree, I will send this in for you.   ? ?Your urine was also abnormal.  I have sent it for culture to see if there is bacteria present and further treatment is needed.  ? ?Your thyroid was normal.  Please let me know if you have any questions. ?

## 2021-10-20 ENCOUNTER — Other Ambulatory Visit
Admission: RE | Admit: 2021-10-20 | Discharge: 2021-10-20 | Disposition: A | Discharge status: Home / Self Care | Payer: No Typology Code available for payment source | Attending: Nurse Practitioner | Admitting: Nurse Practitioner

## 2021-10-20 DIAGNOSIS — R7309 Other abnormal glucose: Secondary | ICD-10-CM | POA: Insufficient documentation

## 2021-10-20 LAB — CYTOLOGY - PAP: Diagnosis: NEGATIVE

## 2021-10-20 LAB — HEMOGLOBIN A1C
Hgb A1c MFr Bld: 5.2 % (ref 4.8–5.6)
Mean Plasma Glucose: 102.54 mg/dL

## 2021-10-21 NOTE — Progress Notes (Signed)
HI Pam. Your A1c was normal.  No evidence of diabetes.  See you at our next visit.

## 2021-10-21 NOTE — Progress Notes (Signed)
HI Erica Fowler.  Your PAP was normal.  We will repeat it in 5 years.  Have a great day.

## 2021-10-22 LAB — URINE CULTURE

## 2021-10-23 ENCOUNTER — Encounter: Payer: Self-pay | Admitting: Nurse Practitioner

## 2021-10-23 ENCOUNTER — Other Ambulatory Visit: Payer: Self-pay

## 2021-10-23 MED ORDER — FLUCONAZOLE 150 MG PO TABS
150.0000 mg | ORAL_TABLET | Freq: Once | ORAL | 0 refills | Status: AC
  Filled 2021-10-23: qty 1, 1d supply, fill #0

## 2021-11-11 ENCOUNTER — Encounter: Payer: Self-pay | Admitting: Internal Medicine

## 2021-11-11 ENCOUNTER — Ambulatory Visit (INDEPENDENT_AMBULATORY_CARE_PROVIDER_SITE_OTHER): Payer: No Typology Code available for payment source | Admitting: Internal Medicine

## 2021-11-11 ENCOUNTER — Encounter: Payer: Self-pay | Admitting: Nurse Practitioner

## 2021-11-11 ENCOUNTER — Other Ambulatory Visit: Payer: Self-pay

## 2021-11-11 ENCOUNTER — Telehealth: Payer: No Typology Code available for payment source | Admitting: Physician Assistant

## 2021-11-11 VITALS — BP 109/74 | HR 71 | Temp 97.9°F | Ht 62.99 in | Wt 145.4 lb

## 2021-11-11 DIAGNOSIS — R3915 Urgency of urination: Secondary | ICD-10-CM

## 2021-11-11 DIAGNOSIS — N390 Urinary tract infection, site not specified: Secondary | ICD-10-CM

## 2021-11-11 LAB — URINALYSIS, ROUTINE W REFLEX MICROSCOPIC
Bilirubin, UA: NEGATIVE
Glucose, UA: NEGATIVE
Ketones, UA: NEGATIVE
Leukocytes,UA: NEGATIVE
Nitrite, UA: NEGATIVE
Protein,UA: NEGATIVE
Specific Gravity, UA: 1.02 (ref 1.005–1.030)
Urobilinogen, Ur: 1 mg/dL (ref 0.2–1.0)
pH, UA: 6 (ref 5.0–7.5)

## 2021-11-11 LAB — MICROSCOPIC EXAMINATION: WBC, UA: NONE SEEN /hpf (ref 0–5)

## 2021-11-11 MED ORDER — OXYBUTYNIN CHLORIDE ER 5 MG PO TB24
5.0000 mg | ORAL_TABLET | Freq: Every day | ORAL | 0 refills | Status: DC
  Filled 2021-11-11: qty 30, 30d supply, fill #0

## 2021-11-11 NOTE — Progress Notes (Signed)
BP 109/74   Pulse 71   Temp 97.9 F (36.6 C) (Oral)   Ht 5' 2.99" (1.6 m)   Wt 145 lb 6.4 oz (66 kg)   SpO2 100%   BMI 25.76 kg/m    Subjective:    Patient ID: Erica Fowler, female    DOB: 19-Jan-1976, 46 y.o.   MRN: [id number]  Chief Complaint  Patient presents with   Urgent urination    Having extreme urgency to urinate for the past 2 says,     HPI: Erica Fowler is a 46 y.o. female  Urinary Frequency  This is a new (nocturia sometimes 1 yr or so now, a1c wnl. frequency anout 10 times.) problem. The current episode started 1 to 4 weeks ago. The problem has been gradually worsening. The pain is mild. There has been no fever. Associated symptoms include frequency and urgency. Pertinent negatives include no chills, discharge, flank pain, hematuria, hesitancy, nausea, possible pregnancy, sweats or vomiting.   Chief Complaint  Patient presents with   Urgent urination    Having extreme urgency to urinate for the past 2 says,     Relevant past medical, surgical, family and social history reviewed and updated as indicated. Interim medical history since our last visit reviewed. Allergies and medications reviewed and updated.  Review of Systems  Constitutional:  Negative for chills.  Gastrointestinal:  Negative for nausea and vomiting.  Genitourinary:  Positive for frequency and urgency. Negative for flank pain, hematuria and hesitancy.   Per HPI unless specifically indicated above     Objective:    BP 109/74   Pulse 71   Temp 97.9 F (36.6 C) (Oral)   Ht 5' 2.99" (1.6 m)   Wt 145 lb 6.4 oz (66 kg)   SpO2 100%   BMI 25.76 kg/m   Wt Readings from Last 3 Encounters:  11/11/21 145 lb 6.4 oz (66 kg)  10/16/21 141 lb 12.8 oz (64.3 kg)  08/05/21 144 lb 12.8 oz (65.7 kg)    Physical Exam Vitals and nursing note reviewed.  Constitutional:      General: She is not in acute distress.    Appearance: Normal appearance. She is not ill-appearing or diaphoretic.   Eyes:     Conjunctiva/sclera: Conjunctivae normal.  Pulmonary:     Breath sounds: No rhonchi.  Abdominal:     General: Abdomen is flat. Bowel sounds are normal. There is no distension.     Palpations: Abdomen is soft. There is no mass.     Tenderness: There is no abdominal tenderness. There is no guarding.  Skin:    General: Skin is warm and dry.     Coloration: Skin is not jaundiced.     Findings: No erythema.  Neurological:     Mental Status: She is alert.    Results for orders placed or performed in visit on 11/11/21  Microscopic Examination   Urine  Result Value Ref Range   WBC, UA None seen 0 - 5 /hpf   RBC 0-2 0 - 2 /hpf   Epithelial Cells (non renal) 0-10 0 - 10 /hpf   Bacteria, UA Few (A) None seen/Few  Urinalysis, Routine w reflex microscopic  Result Value Ref Range   Specific Gravity, UA 1.020 1.005 - 1.030   pH, UA 6.0 5.0 - 7.5   Color, UA Yellow Yellow   Appearance Ur Clear Clear   Leukocytes,UA Negative Negative   Protein,UA Negative Negative/Trace   Glucose, UA Negative Negative  Ketones, UA Negative Negative   RBC, UA Trace (A) Negative   Bilirubin, UA Negative Negative   Urobilinogen, Ur 1.0 0.2 - 1.0 mg/dL   Nitrite, UA Negative Negative   Microscopic Examination See below:         Current Outpatient Medications:    acetaminophen (TYLENOL) 500 MG tablet, Take 500 mg by mouth every 6 (six) hours as needed., Disp: , Rfl:    fexofenadine (ALLEGRA) 180 MG tablet, , Disp: , Rfl:    fluticasone (FLONASE) 50 MCG/ACT nasal spray, Place 2 sprays into both nostrils daily., Disp: 16 g, Rfl: 6   levonorgestrel (MIRENA) 20 MCG/24HR IUD, 1 each by Intrauterine route once., Disp: , Rfl:    oxybutynin (DITROPAN-XL) 5 MG 24 hr tablet, Take 1 tablet (5 mg total) by mouth at bedtime., Disp: 30 tablet, Rfl: 0   Pseudoephedrine HCl (SUDAFED PO), Take by mouth., Disp: , Rfl:    venlafaxine XR (EFFEXOR-XR) 75 MG 24 hr capsule, Take 1 capsule (75 mg total) by mouth  daily with breakfast., Disp: 90 capsule, Rfl: 1   vitamin B-12 (CYANOCOBALAMIN) 1000 MCG tablet, Take 1,000 mcg by mouth daily., Disp: , Rfl:    Vitamin D, Ergocalciferol, (DRISDOL) 1.25 MG (50000 UNIT) CAPS capsule, Take 1 capsule (50,000 Units total) by mouth every 7 (seven) days., Disp: 12 capsule, Rfl: 1    Assessment & Plan:  Urinary urgency  ? Urge incontinence  Will start pt on oxybutynin for such fu with pcp if worsens.  To see urology if she doesn't get better.  NO UTI on UA   Problem List Items Addressed This Visit       Other   Urgency of urination - Primary   Relevant Medications   oxybutynin (DITROPAN-XL) 5 MG 24 hr tablet   Other Relevant Orders   Urine Culture   Urinalysis, Routine w reflex microscopic (Completed)   Microscopic Examination (Completed)     Orders Placed This Encounter  Procedures   Urine Culture   Microscopic Examination   Urinalysis, Routine w reflex microscopic     Meds ordered this encounter  Medications   oxybutynin (DITROPAN-XL) 5 MG 24 hr tablet    Sig: Take 1 tablet (5 mg total) by mouth at bedtime.    Dispense:  30 tablet    Refill:  0     Follow up plan: No follow-ups on file.

## 2021-11-11 NOTE — Progress Notes (Signed)
Because of recurrent symptoms after recent treatment, I feel your condition warrants further evaluation and I recommend that you be seen in a face to face visit so that you can get an updated urine culture in case this infection is now resistant to certain antibiotics.    NOTE: There will be NO CHARGE for this eVisit   If you are having a true medical emergency please call 911.      For an urgent face to face visit, [facility] has six urgent care centers for your convenience:     [facility] [place] Get Driving Directions [phone] [place], [place] [address]    [facility]) Get Driving Directions [phone] [place], [place] [address]  [facility] ([place]) Get Driving Directions [phone] [address] [place],  [place]  [address]  [facility] Get Driving Directions [phone] [address] [place], [place], [place] [address]   [facility] Get Driving Directions  [phone] [address] [place], [place] [address]    [facility] at [facility] Get Driving Directions [phone] [address] [place], [place] [address]  Your MyChart E-visit questionnaire answers were reviewed by a board certified advanced clinical practitioner to complete your personal care plan based on your specific symptoms.  Thank you for using e-Visits.

## 2021-11-18 ENCOUNTER — Ambulatory Visit
Admission: RE | Admit: 2021-11-18 | Discharge: 2021-11-18 | Disposition: A | Discharge status: Home / Self Care | Payer: No Typology Code available for payment source | Source: Ambulatory Visit | Attending: Nurse Practitioner | Admitting: Nurse Practitioner

## 2021-11-18 DIAGNOSIS — Z1231 Encounter for screening mammogram for malignant neoplasm of breast: Secondary | ICD-10-CM | POA: Insufficient documentation

## 2021-11-18 NOTE — Progress Notes (Signed)
Please let patient know her Mammogram did not show any evidence of a malignancy.  The recommendation is to repeat the Mammogram in 1 year.  

## 2021-11-19 ENCOUNTER — Encounter: Payer: Self-pay | Admitting: Internal Medicine

## 2021-11-19 LAB — URINE CULTURE

## 2021-11-23 ENCOUNTER — Ambulatory Visit: Payer: No Typology Code available for payment source | Admitting: Nurse Practitioner

## 2021-11-24 ENCOUNTER — Ambulatory Visit (INDEPENDENT_AMBULATORY_CARE_PROVIDER_SITE_OTHER): Payer: No Typology Code available for payment source | Admitting: Internal Medicine

## 2021-11-24 ENCOUNTER — Other Ambulatory Visit: Payer: Self-pay

## 2021-11-24 ENCOUNTER — Encounter: Payer: Self-pay | Admitting: Internal Medicine

## 2021-11-24 VITALS — BP 116/79 | HR 71 | Temp 98.4°F | Ht 62.99 in | Wt 146.4 lb

## 2021-11-24 DIAGNOSIS — N3941 Urge incontinence: Secondary | ICD-10-CM | POA: Diagnosis not present

## 2021-11-24 DIAGNOSIS — R3915 Urgency of urination: Secondary | ICD-10-CM

## 2021-11-24 MED ORDER — OXYBUTYNIN CHLORIDE ER 5 MG PO TB24
5.0000 mg | ORAL_TABLET | Freq: Every day | ORAL | 6 refills | Status: DC
  Filled 2021-11-24: qty 30, 30d supply, fill #0

## 2021-11-24 MED ORDER — MECLIZINE HCL 25 MG PO TABS
12.5000 mg | ORAL_TABLET | Freq: Three times a day (TID) | ORAL | 0 refills | Status: DC | PRN
Start: 2021-11-24 — End: 2023-05-09
  Filled 2021-11-24: qty 15, 10d supply, fill #0

## 2021-11-24 NOTE — Progress Notes (Unsigned)
BP 116/79   Pulse 71   Temp 98.4 F (36.9 C) (Oral)   Ht 5' 2.99" (1.6 m)   Wt 146 lb 6.4 oz (66.4 kg)   SpO2 99%   BMI 25.94 kg/m    Subjective:    Patient ID: Erica Fowler, female    DOB: September 30, 1975, 46 y.o.   MRN: [id number]  Chief Complaint  Patient presents with  . Dizziness    Here for f/u Has been dizzy for the past month  . UTI    Here for f/u    HPI: Erica Fowler is a 46 y.o. female  Dizziness This is a chronic (co dizziness when she walks down stairs) problem. Associated symptoms include vertigo. Pertinent negatives include no abdominal pain, anorexia, arthralgias, change in bowel habit, chest pain, chills, congestion, coughing, diaphoresis, fatigue, fever, headaches, joint swelling, myalgias, nausea, neck pain, numbness, rash, swollen glands, urinary symptoms, visual change, vomiting or weakness.    Chief Complaint  Patient presents with  . Dizziness    Here for f/u Has been dizzy for the past month  . UTI    Here for f/u    Relevant past medical, surgical, family and social history reviewed and updated as indicated. Interim medical history since our last visit reviewed. Allergies and medications reviewed and updated.  Review of Systems  Constitutional:  Negative for chills, diaphoresis, fatigue and fever.  HENT:  Negative for congestion.   Respiratory:  Negative for cough.   Cardiovascular:  Negative for chest pain.  Gastrointestinal:  Negative for abdominal pain, anorexia, change in bowel habit, nausea and vomiting.  Musculoskeletal:  Negative for arthralgias, joint swelling, myalgias and neck pain.  Skin:  Negative for rash.  Neurological:  Positive for dizziness and vertigo. Negative for weakness, numbness and headaches.    Per HPI unless specifically indicated above     Objective:    BP 116/79   Pulse 71   Temp 98.4 F (36.9 C) (Oral)   Ht 5' 2.99" (1.6 m)   Wt 146 lb 6.4 oz (66.4 kg)   SpO2 99%   BMI 25.94 kg/m   Wt  Readings from Last 3 Encounters:  11/24/21 146 lb 6.4 oz (66.4 kg)  11/11/21 145 lb 6.4 oz (66 kg)  10/16/21 141 lb 12.8 oz (64.3 kg)    Physical Exam  Results for orders placed or performed in visit on 11/11/21  Urine Culture   Specimen: Urine   UR  Result Value Ref Range   Urine Culture, Routine Final report (A)    Organism ID, Bacteria Escherichia coli (A)    Antimicrobial Susceptibility Comment   Microscopic Examination   Urine  Result Value Ref Range   WBC, UA None seen 0 - 5 /hpf   RBC 0-2 0 - 2 /hpf   Epithelial Cells (non renal) 0-10 0 - 10 /hpf   Bacteria, UA Few (A) None seen/Few  Urinalysis, Routine w reflex microscopic  Result Value Ref Range   Specific Gravity, UA 1.020 1.005 - 1.030   pH, UA 6.0 5.0 - 7.5   Color, UA Yellow Yellow   Appearance Ur Clear Clear   Leukocytes,UA Negative Negative   Protein,UA Negative Negative/Trace   Glucose, UA Negative Negative   Ketones, UA Negative Negative   RBC, UA Trace (A) Negative   Bilirubin, UA Negative Negative   Urobilinogen, Ur 1.0 0.2 - 1.0 mg/dL   Nitrite, UA Negative Negative   Microscopic Examination See below:  Current Outpatient Medications:  .  acetaminophen (TYLENOL) 500 MG tablet, Take 500 mg by mouth every 6 (six) hours as needed., Disp: , Rfl:  .  fexofenadine (ALLEGRA) 180 MG tablet, , Disp: , Rfl:  .  fluticasone (FLONASE) 50 MCG/ACT nasal spray, Place 2 sprays into both nostrils daily., Disp: 16 g, Rfl: 6 .  levonorgestrel (MIRENA) 20 MCG/24HR IUD, 1 each by Intrauterine route once., Disp: , Rfl:  .  oxybutynin (DITROPAN-XL) 5 MG 24 hr tablet, Take 1 tablet (5 mg total) by mouth at bedtime., Disp: 30 tablet, Rfl: 0 .  Pseudoephedrine HCl (SUDAFED PO), Take by mouth., Disp: , Rfl:  .  venlafaxine XR (EFFEXOR-XR) 75 MG 24 hr capsule, Take 1 capsule (75 mg total) by mouth daily with breakfast., Disp: 90 capsule, Rfl: 1 .  vitamin B-12 (CYANOCOBALAMIN) 1000 MCG tablet, Take 1,000 mcg by mouth  daily., Disp: , Rfl:  .  Vitamin D, Ergocalciferol, (DRISDOL) 1.25 MG (50000 UNIT) CAPS capsule, Take 1 capsule (50,000 Units total) by mouth every 7 (seven) days., Disp: 12 capsule, Rfl: 1    Assessment & Plan:  Urge incontinence better with oxybutynin  2. Dizziness has had PT  -  Problem List Items Addressed This Visit   None    No orders of the defined types were placed in this encounter.    No orders of the defined types were placed in this encounter.    Follow up plan: No follow-ups on file.  Health Maintenance :***  Mammogram/ Paps smear: DEXA: PSA :  Cscope : Pneumonia vaccine : prenvanar / pneumovax  FLu vaccine :  Labs next visit : CBC, CMP, FLP, HBA1C, TSH, PSA, urine microalbumin Labs 1 week prior to next visit.

## 2021-12-31 ENCOUNTER — Other Ambulatory Visit: Payer: Self-pay

## 2022-02-18 ENCOUNTER — Ambulatory Visit: Payer: No Typology Code available for payment source

## 2022-02-23 ENCOUNTER — Ambulatory Visit: Payer: No Typology Code available for payment source

## 2022-02-25 ENCOUNTER — Ambulatory Visit: Payer: No Typology Code available for payment source

## 2022-03-02 ENCOUNTER — Ambulatory Visit: Payer: No Typology Code available for payment source

## 2022-03-05 ENCOUNTER — Ambulatory Visit: Payer: No Typology Code available for payment source

## 2022-03-09 ENCOUNTER — Ambulatory Visit: Payer: No Typology Code available for payment source

## 2022-03-11 ENCOUNTER — Ambulatory Visit: Payer: No Typology Code available for payment source

## 2022-03-15 ENCOUNTER — Ambulatory Visit: Payer: No Typology Code available for payment source

## 2022-03-18 ENCOUNTER — Other Ambulatory Visit: Payer: Self-pay

## 2022-03-18 MED ORDER — METHOCARBAMOL 500 MG PO TABS
500.0000 mg | ORAL_TABLET | Freq: Four times a day (QID) | ORAL | 1 refills | Status: DC | PRN
  Filled 2022-03-18: qty 30, 5d supply, fill #0

## 2022-03-19 ENCOUNTER — Other Ambulatory Visit: Payer: Self-pay

## 2022-04-12 ENCOUNTER — Other Ambulatory Visit: Payer: Self-pay

## 2022-04-22 ENCOUNTER — Other Ambulatory Visit: Payer: Self-pay

## 2022-04-22 ENCOUNTER — Ambulatory Visit: Payer: No Typology Code available for payment source | Admitting: Dermatology

## 2022-04-22 DIAGNOSIS — L821 Other seborrheic keratosis: Secondary | ICD-10-CM

## 2022-04-22 DIAGNOSIS — L578 Other skin changes due to chronic exposure to nonionizing radiation: Secondary | ICD-10-CM

## 2022-04-22 DIAGNOSIS — Z1283 Encounter for screening for malignant neoplasm of skin: Secondary | ICD-10-CM

## 2022-04-22 DIAGNOSIS — L814 Other melanin hyperpigmentation: Secondary | ICD-10-CM

## 2022-04-22 DIAGNOSIS — I781 Nevus, non-neoplastic: Secondary | ICD-10-CM | POA: Diagnosis not present

## 2022-04-22 DIAGNOSIS — D229 Melanocytic nevi, unspecified: Secondary | ICD-10-CM

## 2022-04-22 DIAGNOSIS — L304 Erythema intertrigo: Secondary | ICD-10-CM | POA: Diagnosis not present

## 2022-04-22 DIAGNOSIS — L2081 Atopic neurodermatitis: Secondary | ICD-10-CM | POA: Diagnosis not present

## 2022-04-22 DIAGNOSIS — Z79899 Other long term (current) drug therapy: Secondary | ICD-10-CM

## 2022-04-22 MED ORDER — MOMETASONE FUROATE 0.1 % EX CREA
TOPICAL_CREAM | CUTANEOUS | 5 refills | Status: DC
  Filled 2022-04-22: qty 45, 30d supply, fill #0

## 2022-04-22 NOTE — Patient Instructions (Addendum)
Gentle Skin Care Guide  1. Bathe no more than once a day.  2. Avoid bathing in hot water  3. Use a mild soap like Dove, Vanicream, Cetaphil, CeraVe. Can use Lever 2000 or Cetaphil antibacterial soap  4. Use soap only where you need it. On most days, use it under your arms, between your legs, and on your feet. Let the water rinse other areas unless visibly dirty.  5. When you get out of the bath/shower, use a towel to gently blot your skin dry, don't rub it.  6. While your skin is still a little damp, apply a moisturizing cream such as CeraVe cream,  7. Reapply moisturizer any time you start to itch or feel dry.  8. Sometimes using free and clear laundry detergents can be helpful. Fabric softener sheets should be avoided. Downy Free & Gentle liquid, or any liquid fabric softener that is free of dyes and perfumes, it acceptable to use  9. If your doctor has given you prescription creams you may apply moisturizers over them   Due to recent changes in healthcare laws, you may see results of your pathology and/or laboratory studies on MyChart before the doctors have had a chance to review them. We understand that in some cases there may be results that are confusing or concerning to you. Please understand that not all results are received at the same time and often the doctors may need to interpret multiple results in order to provide you with the best plan of care or course of treatment. Therefore, we ask that you please give [place] 2 business days to thoroughly review all your results before contacting the office for clarification. Should we see a critical lab result, you will be contacted sooner.   If You Need Anything After Your Visit  If you have any questions or concerns for your doctor, please call our main line at [phone] and press option 4 to reach your doctor's medical assistant. If no one answers, please leave a voicemail as directed and we will return your call as soon as  possible. Messages left after 4 pm will be answered the following business day.   You may also send [place] a message via [place]. We typically respond to MyChart messages within 1-2 business days.  For prescription refills, please ask your pharmacy to contact our office. Our fax number is [phone].  If you have an urgent issue when the clinic is closed that cannot wait until the next business day, you can page your doctor at the number below.    Please note that while we do our best to be available for urgent issues outside of office hours, we are not available 24/7.   If you have an urgent issue and are unable to reach [place], you may choose to seek medical care at your doctor's office, retail clinic, urgent care center, or emergency room.  If you have a medical emergency, please immediately call 911 or go to the emergency department.  Pager Numbers  - Dr. Nehemiah Massed: [phone]  - Dr. Laurence Ferrari: [phone]  - Dr. Nicole Kindred: [phone]  In the event of inclement weather, please call our main line at [phone] for an update on the status of any delays or closures.  Dermatology Medication Tips: Please keep the boxes that topical medications come in in order to help keep track of the instructions about where and how to use these. Pharmacies typically print the medication instructions only on the boxes and not  directly on the medication tubes.   If your medication is too expensive, please contact our office at [phone] option 4 or send [place] a message through [place].   We are unable to tell what your co-pay for medications will be in advance as this is different depending on your insurance coverage. However, we may be able to find a substitute medication at lower cost or fill out paperwork to get insurance to cover a needed medication.   If a prior authorization is required to get your medication covered by your insurance company, please allow [place] 1-2 business days to complete this  process.  Drug prices often vary depending on where the prescription is filled and some pharmacies may offer cheaper prices.  The website www.goodrx.com contains coupons for medications through different pharmacies. The prices here do not account for what the cost may be with help from insurance (it may be cheaper with your insurance), but the website can give you the price if you did not use any insurance.  - You can print the associated coupon and take it with your prescription to the pharmacy.  - You may also stop by our office during regular business hours and pick up a GoodRx coupon card.  - If you need your prescription sent electronically to a different pharmacy, notify our office through [facility] or by phone at [phone] option 4.     Si Usted Necesita Algo Despus de Su Visita  Tambin puede enviarnos un mensaje a travs de Pharmacist, community. Por lo general respondemos a los mensajes de MyChart en el transcurso de 1 a 2 das hbiles.  Para renovar recetas, por favor pida a su farmacia que se ponga en contacto con nuestra oficina. Harland Dingwall de fax es [place] [phone].  Si tiene un asunto urgente cuando la clnica est cerrada y que no puede esperar hasta el siguiente da hbil, puede llamar/localizar a su doctor(a) al nmero que aparece a continuacin.   Por favor, tenga en cuenta que aunque hacemos todo lo posible para estar disponibles para asuntos urgentes fuera del horario de [place], no estamos disponibles las 24 horas del da, los 7 das de la [place].   Si tiene un problema urgente y no puede comunicarse con nosotros, puede optar por buscar atencin mdica  en el consultorio de su doctor(a), en una clnica privada, en un centro de atencin urgente o en una sala de emergencias.  Si tiene Engineering geologist, por favor llame inmediatamente al 911 o vaya a la sala de emergencias.  Nmeros de bper  - Dr. Nehemiah Massed: [phone]  - Dra. Moye: [phone]  - Dra.  Nicole Kindred: [phone]  En caso de inclemencias del [place], por favor llame a Johnsie Kindred principal al [phone] para una actualizacin sobre el [place] de cualquier retraso o cierre.  Consejos para la medicacin en dermatologa: Por favor, guarde las cajas en las que vienen los medicamentos de uso tpico para ayudarle a seguir las instrucciones sobre dnde y cmo usarlos. Las farmacias generalmente imprimen las instrucciones del medicamento slo en las cajas y no directamente en los tubos del [place].   Si su medicamento es muy caro, por favor, pngase en contacto con Zigmund Daniel llamando al [phone] y presione la opcin 4 o envenos un mensaje a travs de Pharmacist, community.   No podemos decirle cul ser su copago por los medicamentos por adelantado ya que esto es diferente dependiendo de la cobertura de su seguro. Sin embargo, es posible que podamos encontrar un medicamento  sustituto a Electrical engineer un formulario para que el seguro cubra el medicamento que se considera necesario.   Si se requiere una autorizacin previa para que su compaa de seguros [place] su medicamento, por favor permtanos de 1 a 2 das hbiles para completar este proceso.  Los precios de los medicamentos varan con frecuencia dependiendo del Environmental consultant de dnde se surte la receta y alguna farmacias pueden ofrecer precios ms baratos.  El sitio web www.goodrx.com tiene cupones para medicamentos de Airline pilot. Los precios aqu no tienen en cuenta lo que podra costar con la ayuda del seguro (puede ser ms barato con su seguro), pero el sitio web puede darle el precio si no utiliz Research scientist (physical sciences).  - Puede imprimir el cupn correspondiente y llevarlo con su receta a la farmacia.  - Tambin puede pasar por nuestra oficina durante el horario de atencin regular y Charity fundraiser una tarjeta de cupones de GoodRx.  - Si necesita que su receta se enve electrnicamente a una farmacia diferente, informe a nuestra oficina a  travs de MyChart de [place] o por telfono llamando al [phone] y presione la opcin 4.

## 2022-04-22 NOTE — Progress Notes (Signed)
New Patient Visit  Subjective  Erica Fowler is a 46 y.o. female who presents for the following: Annual Exam.  The patient presents for Total-Body Skin Exam (TBSE) for skin cancer screening and mole check.  The patient has spots, moles and lesions to be evaluated, some may be new or changing and the patient has concerns that these could be cancer.   The following portions of the chart were reviewed this encounter and updated as appropriate:   Tobacco  Allergies  Meds  Problems  Med Hx  Surg Hx  Fam Hx     Review of Systems:  No other skin or systemic complaints except as noted in HPI or Assessment and Plan.  Objective  Well appearing patient in no apparent distress; mood and affect are within normal limits.  A full examination was performed including scalp, head, eyes, ears, nose, lips, neck, chest, axillae, abdomen, back, buttocks, bilateral upper extremities, bilateral lower extremities, hands, feet, fingers, toes, fingernails, and toenails. All findings within normal limits unless otherwise noted below.  left upper lip, left lateral infrorbital - Dilated blood vessel     inframammary Clear skin-  bilateral ankles Lichenification   Left Ear Diffuse scaly erythematous macules with underlying dyspigmentation.       Assessment & Plan  Telangiectasia left upper lip, left lateral infrorbital - Benign appearing on exam - Call for changes  Discussed the treatment option of BBL/laser.  Typically we recommend 1-3 treatment sessions about 5-8 weeks apart for best results.  The patient's condition may require "maintenance treatments" in the future.  The fee for BBL / laser treatments is $350 per treatment session for the whole face.  A fee can be quoted for other parts of the body. Insurance typically does not pay for BBL/laser treatments and therefore the fee is an out-of-pocket cost.  Intertrigo inframammary Intertrigo by history- non active today   Intertrigo is a  chronic recurrent rash that occurs in skin fold areas that may be associated with friction; heat; moisture; yeast; fungus; and bacteria.  It is exacerbated by increased movement / activity; sweating; and higher atmospheric temperature.   Patient decline treatment   Atopic neurodermatitis bilateral ankles  Atopic dermatitis (eczema) is a chronic, relapsing, pruritic condition that can significantly affect quality of life. It is often associated with allergic rhinitis and/or asthma and can require treatment with topical medications, phototherapy, or in severe cases biologic injectable medication (Dupixent; Adbry) or Oral JAK inhibitors.   Start Mometasone cream apply to affected skin qd-bid prn  Start otc Cerave cream moisturizer daily  May consider Elastin moisturizer   Related Medications mometasone (ELOCON) 0.1 % cream Apply to affected skin once to twice daily as needed  Actinic skin damage Left Ear Observe  Lentigines - Scattered tan macules - Due to sun exposure - Benign-appearing, observe - Recommend daily broad spectrum sunscreen SPF 30+ to sun-exposed areas, reapply every 2 hours as needed. - Call for any changes  Seborrheic Keratoses - Stuck-on, waxy, tan-brown papules and/or plaques  - Benign-appearing - Discussed benign etiology and prognosis. - Observe - Call for any changes  Melanocytic Nevi - Tan-brown and/or pink-flesh-colored symmetric macules and papules - Benign appearing on exam today - Observation - Call clinic for new or changing moles - Recommend daily use of broad spectrum spf 30+ sunscreen to sun-exposed areas.   Hemangiomas - Red papules - Discussed benign nature - Observe - Call for any changes  Actinic Damage - Chronic condition, secondary to  cumulative UV/sun exposure - diffuse scaly erythematous macules with underlying dyspigmentation - Recommend daily broad spectrum sunscreen SPF 30+ to sun-exposed areas, reapply every 2 hours as needed.   - Staying in the shade or wearing long sleeves, sun glasses (UVA+UVB protection) and wide brim hats (4-inch brim around the entire circumference of the hat) are also recommended for sun protection.  - Call for new or changing lesions.  Skin cancer screening performed today.   Return in about 1 year (around 04/23/2023). Documentation: I have reviewed the above documentation for accuracy and completeness, and I agree with the above.  Sarina Ser, MD

## 2022-05-11 ENCOUNTER — Encounter: Payer: Self-pay | Admitting: Dermatology

## 2022-07-07 ENCOUNTER — Other Ambulatory Visit: Payer: Self-pay | Admitting: Nurse Practitioner

## 2022-07-07 ENCOUNTER — Other Ambulatory Visit: Payer: Self-pay

## 2022-07-07 DIAGNOSIS — F439 Reaction to severe stress, unspecified: Secondary | ICD-10-CM

## 2022-07-07 MED ORDER — VENLAFAXINE HCL ER 75 MG PO CP24
75.0000 mg | ORAL_CAPSULE | Freq: Every day | ORAL | 0 refills | Status: DC
  Filled 2022-07-07: qty 90, 90d supply, fill #0

## 2022-07-07 NOTE — Telephone Encounter (Signed)
Requested Prescriptions  Pending Prescriptions Disp Refills   venlafaxine XR (EFFEXOR-XR) 75 MG 24 hr capsule 90 capsule 1    Sig: Take 1 capsule (75 mg total) by mouth daily with breakfast.     Psychiatry: Antidepressants - SNRI - desvenlafaxine & venlafaxine Failed - 07/07/2022  9:01 AM      Failed - Valid encounter within last 6 months    Recent Outpatient Visits           7 months ago Urge incontinence of urine   [place] Vigg, Avanti, MD   7 months ago Urgency of urination   [place] Vigg, Avanti, MD   8 months ago Annual physical exam   [place] Jon Billings, NP   11 months ago Annual physical exam   [place] Jon Billings, NP   12 months ago Mouth sores   [place], NP       Future Appointments             In 2 weeks Jon Billings, NP [facility]            Failed - Lipid Panel in normal range within the last 12 months    Cholesterol, Total  Date Value Ref Range Status  10/16/2021 200 (H) 100 - 199 mg/dL Final   LDL Chol Calc (NIH)  Date Value Ref Range Status  10/16/2021 126 (H) 0 - 99 mg/dL Final   LDL Direct  Date Value Ref Range Status  11/13/2019 117 (H) 0 - 99 mg/dL Final   HDL  Date Value Ref Range Status  10/16/2021 61 >39 mg/dL Final   Triglycerides  Date Value Ref Range Status  10/16/2021 70 0 - 149 mg/dL Final         Passed - Cr in normal range and within 360 days    Creatinine, Ser  Date Value Ref Range Status  10/16/2021 0.74 0.57 - 1.00 mg/dL Final         Passed - Last BP in normal range    BP Readings from Last 1 Encounters:  11/24/21 116/79

## 2022-07-23 ENCOUNTER — Encounter: Payer: Self-pay | Admitting: Nurse Practitioner

## 2022-07-23 ENCOUNTER — Other Ambulatory Visit: Payer: Self-pay

## 2022-07-23 ENCOUNTER — Telehealth (INDEPENDENT_AMBULATORY_CARE_PROVIDER_SITE_OTHER): Payer: 59 | Admitting: Nurse Practitioner

## 2022-07-23 DIAGNOSIS — F439 Reaction to severe stress, unspecified: Secondary | ICD-10-CM | POA: Diagnosis not present

## 2022-07-23 DIAGNOSIS — F419 Anxiety disorder, unspecified: Secondary | ICD-10-CM | POA: Diagnosis not present

## 2022-07-23 MED ORDER — VENLAFAXINE HCL ER 75 MG PO CP24
75.0000 mg | ORAL_CAPSULE | Freq: Every day | ORAL | 1 refills | Status: DC
  Filled 2022-07-23 – 2022-10-05 (×2): qty 90, 90d supply, fill #0
  Filled 2023-01-27: qty 90, 90d supply, fill #1

## 2022-07-23 NOTE — Assessment & Plan Note (Signed)
Chronic.  Controlled.  Continue with current medication regimen of Effexor 60m daily.  Refills sent today.  Will check labs at next visit. Return to clinic in 3 months for Physical.  Call sooner if concerns arise.

## 2022-07-23 NOTE — Patient Instructions (Signed)
[facility]

## 2022-07-23 NOTE — Progress Notes (Signed)
There were no vitals taken for this visit.   Subjective:    Patient ID: Erica Fowler, female    DOB: 03-05-1976, 47 y.o.   MRN: [id number]  HPI: Erica Fowler is a 47 y.o. female  Chief Complaint  Patient presents with   Depression   Anxiety   DEPRESSION/ANXIETY Patient presents to clinic for a medication refill.  Patient states she is having trouble sleeping.  She has been using benadryl.  She is having some hot flashes at night.  8/14 nights she has been waking up sweating.  Now it is happening during the day at times.  Patient has an IUD mostly having regular periods.  She feels like the Effexor is still working for her.  Denies SI.    Flowsheet Row Video Visit from 07/23/2022 in [place]  PHQ-9 Total Score 3         07/23/2022   11:04 AM 11/24/2021    4:09 PM 11/11/2021    2:29 PM 10/16/2021    2:49 PM  GAD 7 : Generalized Anxiety Score  Nervous, Anxious, on Edge 1 0 0 1  Control/stop worrying 0 0 1 0  Worry too much - different things 0 0 1 0  Trouble relaxing 0 0 0 0  Restless 0 0 0 0  Easily annoyed or irritable 0 0 0 1  Afraid - awful might happen 0 0 0 0  Total GAD 7 Score 1 0 2 2  Anxiety Difficulty Not difficult at all Not difficult at all Not difficult at all Not difficult at all      Relevant past medical, surgical, family and social history reviewed and updated as indicated. Interim medical history since our last visit reviewed. Allergies and medications reviewed and updated.  Review of Systems  Endocrine: Positive for heat intolerance.  Psychiatric/Behavioral:  Positive for dysphoric mood. The patient is nervous/anxious.     Per HPI unless specifically indicated above     Objective:    There were no vitals taken for this visit.  Wt Readings from Last 3 Encounters:  11/24/21 146 lb 6.4 oz (66.4 kg)  11/11/21 145 lb 6.4 oz (66 kg)  10/16/21 141 lb 12.8 oz (64.3 kg)    Physical Exam Vitals and nursing note  reviewed.  HENT:     Head: Normocephalic.     Right Ear: Hearing normal.     Left Ear: Hearing normal.     Nose: Nose normal.  Eyes:     Pupils: Pupils are equal, round, and reactive to light.  Pulmonary:     Effort: Pulmonary effort is normal. No respiratory distress.  Neurological:     Mental Status: She is alert.  Psychiatric:        Mood and Affect: Mood normal.        Behavior: Behavior normal.        Thought Content: Thought content normal.        Judgment: Judgment normal.     Results for orders placed or performed in visit on 11/11/21  Urine Culture   Specimen: Urine   UR  Result Value Ref Range   Urine Culture, Routine Final report (A)    Organism ID, Bacteria Escherichia coli (A)    Antimicrobial Susceptibility Comment   Microscopic Examination   Urine  Result Value Ref Range   WBC, UA None seen 0 - 5 /hpf   RBC, Urine 0-2 0 - 2 /hpf   Epithelial  Cells (non renal) 0-10 0 - 10 /hpf   Bacteria, UA Few (A) None seen/Few  Urinalysis, Routine w reflex microscopic  Result Value Ref Range   Specific Gravity, UA 1.020 1.005 - 1.030   pH, UA 6.0 5.0 - 7.5   Color, UA Yellow Yellow   Appearance Ur Clear Clear   Leukocytes,UA Negative Negative   Protein,UA Negative Negative/Trace   Glucose, UA Negative Negative   Ketones, UA Negative Negative   RBC, UA Trace (A) Negative   Bilirubin, UA Negative Negative   Urobilinogen, Ur 1.0 0.2 - 1.0 mg/dL   Nitrite, UA Negative Negative   Microscopic Examination See below:       Assessment & Plan:   Problem List Items Addressed This Visit       Other   Anxiety - Primary    Chronic.  Controlled.  Continue with current medication regimen of Effexor 78m daily.  Refills sent today.  Will check labs at next visit. Return to clinic in 3 months for Physical.  Call sooner if concerns arise.        Relevant Medications   venlafaxine XR (EFFEXOR-XR) 75 MG 24 hr capsule   Other Visit Diagnoses     Situational stress        Relevant Medications   venlafaxine XR (EFFEXOR-XR) 75 MG 24 hr capsule        Follow up plan: Return in about 3 months (around 10/21/2022) for Physical and Fasting labs.  This visit was completed via MyChart due to the restrictions of the COVID-19 pandemic. All issues as above were discussed and addressed. Physical exam was done as above through visual confirmation on MyChart. If it was felt that the patient should be evaluated in the office, they were directed there. The patient verbally consented to this visit. Location of the patient: Home Location of the provider: Office Those involved with this call:  Provider: KJon Billings NP CMA: BYvonna Alanis CMA Front Desk/Registration: ILynnell Catalan This encounter was conducted via video.  I spent 20 mins dedicated to the care of this patient on the date of this encounter to include previsit review of symptoms plan of care, and follow up, face to face time with the patient, and post visit ordering of testing.

## 2022-10-05 ENCOUNTER — Other Ambulatory Visit: Payer: Self-pay

## 2022-10-25 ENCOUNTER — Other Ambulatory Visit: Payer: Self-pay | Admitting: Oncology

## 2022-10-25 DIAGNOSIS — Z1379 Encounter for other screening for genetic and chromosomal anomalies: Secondary | ICD-10-CM

## 2022-10-26 ENCOUNTER — Other Ambulatory Visit
Admission: RE | Admit: 2022-10-26 | Discharge: 2022-10-26 | Disposition: A | Discharge status: Home / Self Care | Payer: 59 | Source: Ambulatory Visit | Attending: Oncology | Admitting: Oncology

## 2022-10-26 DIAGNOSIS — Z1379 Encounter for other screening for genetic and chromosomal anomalies: Secondary | ICD-10-CM

## 2022-11-18 LAB — HELIX MOLECULAR SCREEN: Genetic Analysis Overall Interpretation: NEGATIVE

## 2023-02-24 IMAGING — MG MM DIGITAL SCREENING BILAT W/ TOMO AND CAD
8 series · 8 of 24 positions shown · non-contrast
Comparison: Previous exam(s).

CLINICAL DATA: Screening.

EXAM:
DIGITAL SCREENING BILATERAL MAMMOGRAM WITH TOMOSYNTHESIS AND CAD
TECHNIQUE: Bilateral screening digital craniocaudal and mediolateral oblique
mammograms were obtained. Bilateral screening digital breast
tomosynthesis was performed. The images were evaluated with
computer-aided detection.

[L MLO synth-2D]
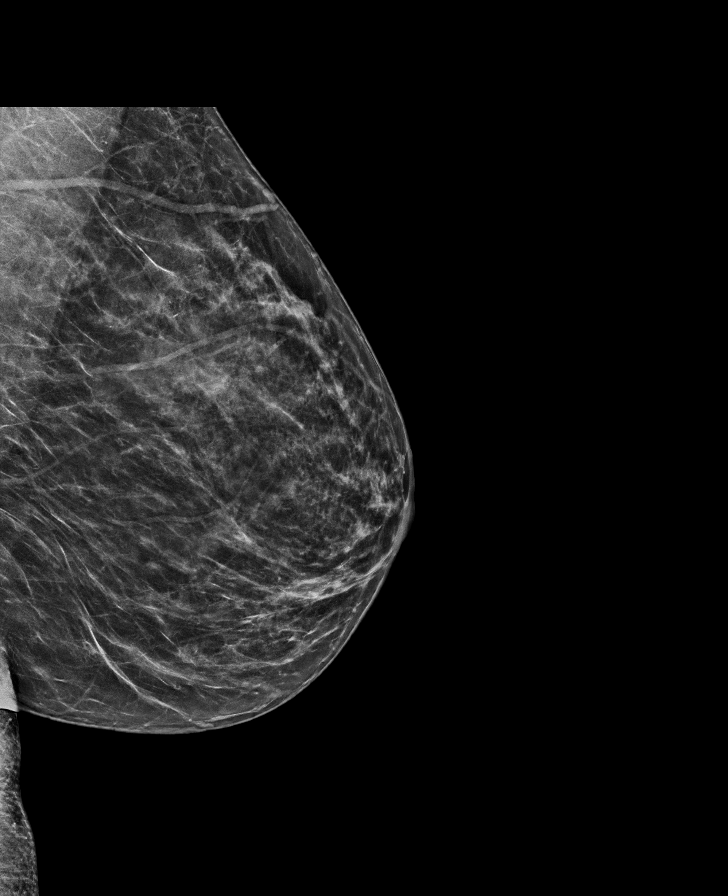

[R CC synth-2D]
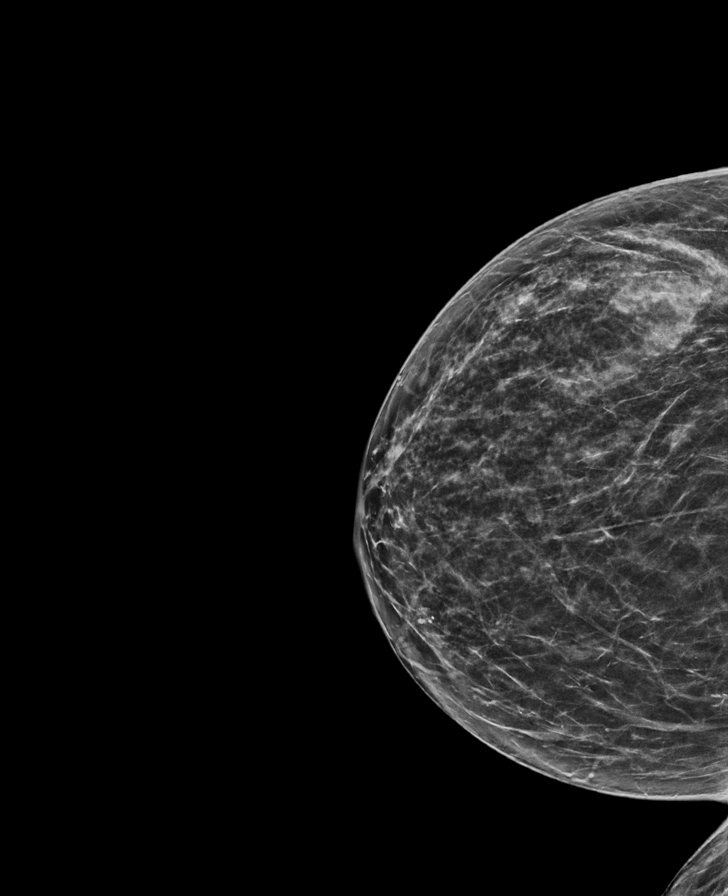

[R MLO synth-2D]
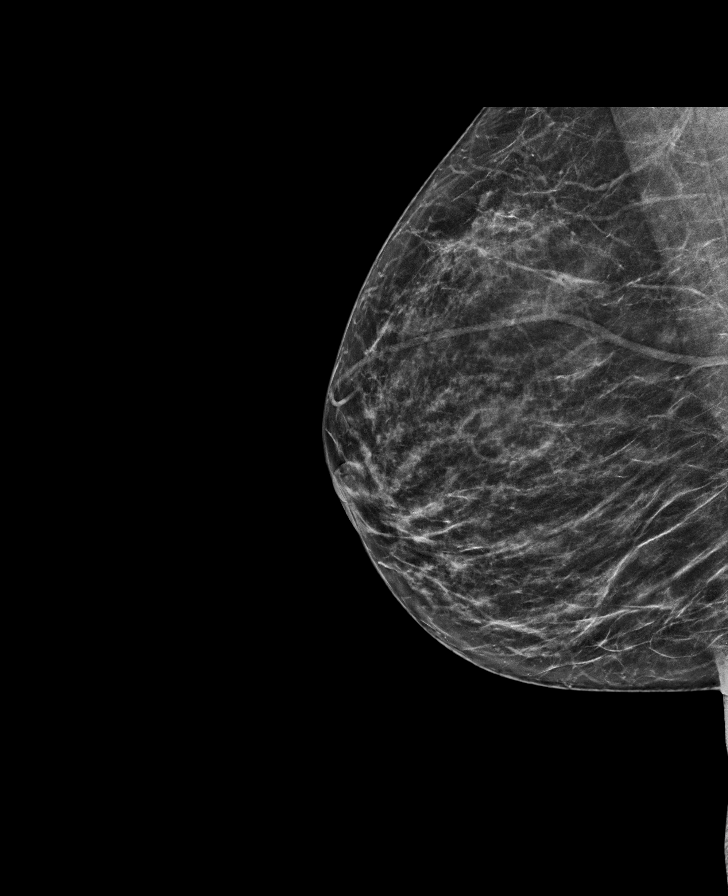

[L CC synth-2D]
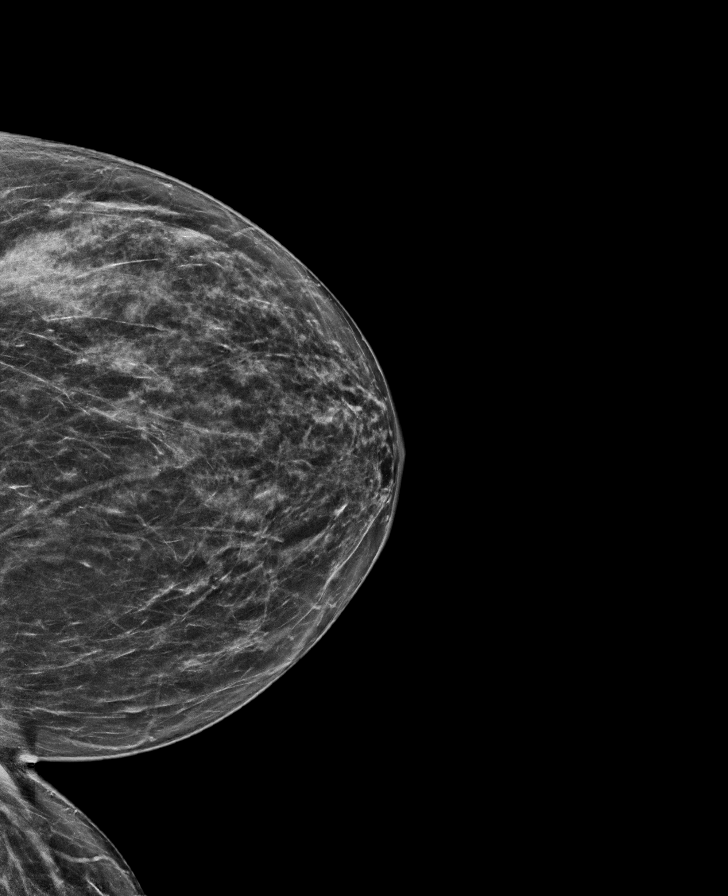

[L MLO tomo · tomo slice 33/64.0]
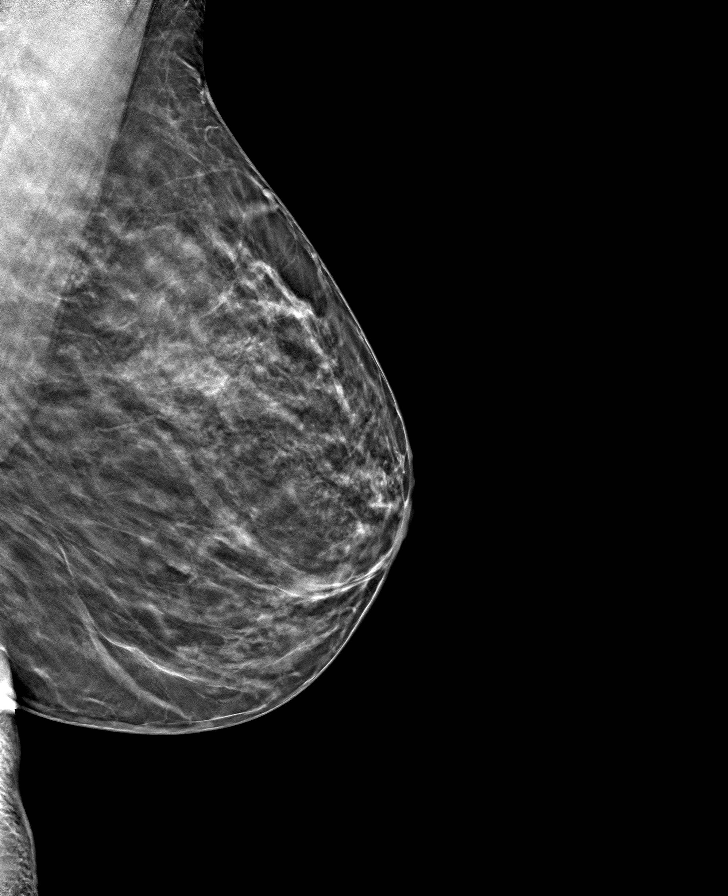

[R CC tomo · tomo slice 31/62.0]
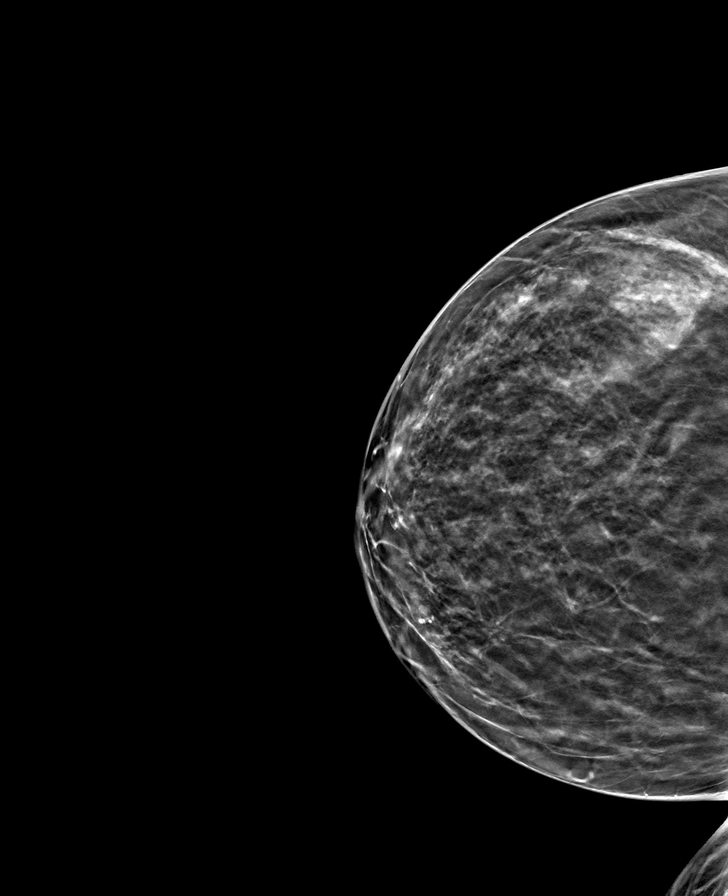

[R MLO tomo · tomo slice 33/66.0]
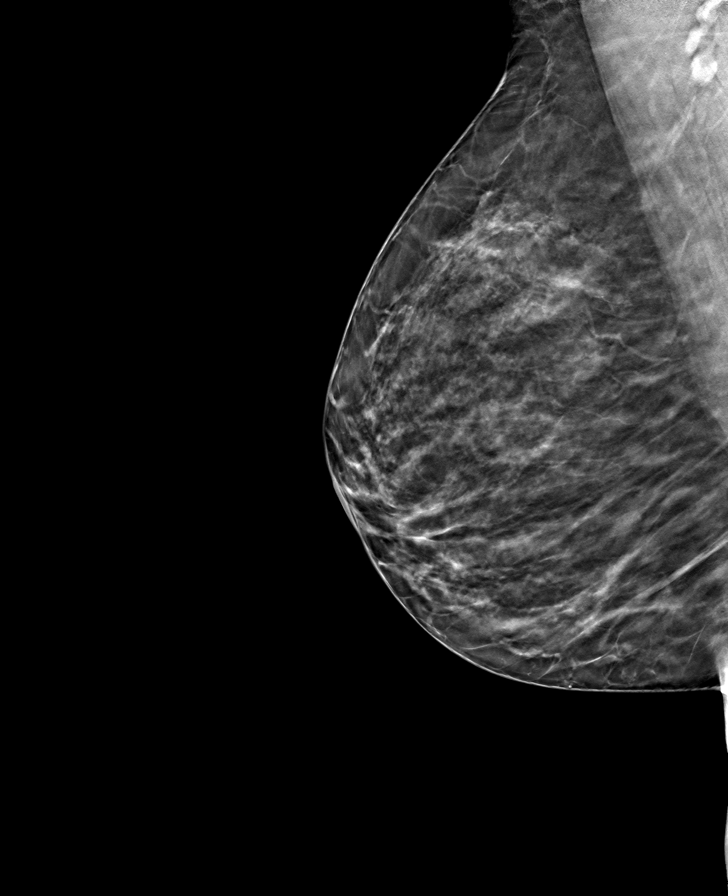

[L CC tomo · tomo slice 33/64.0]
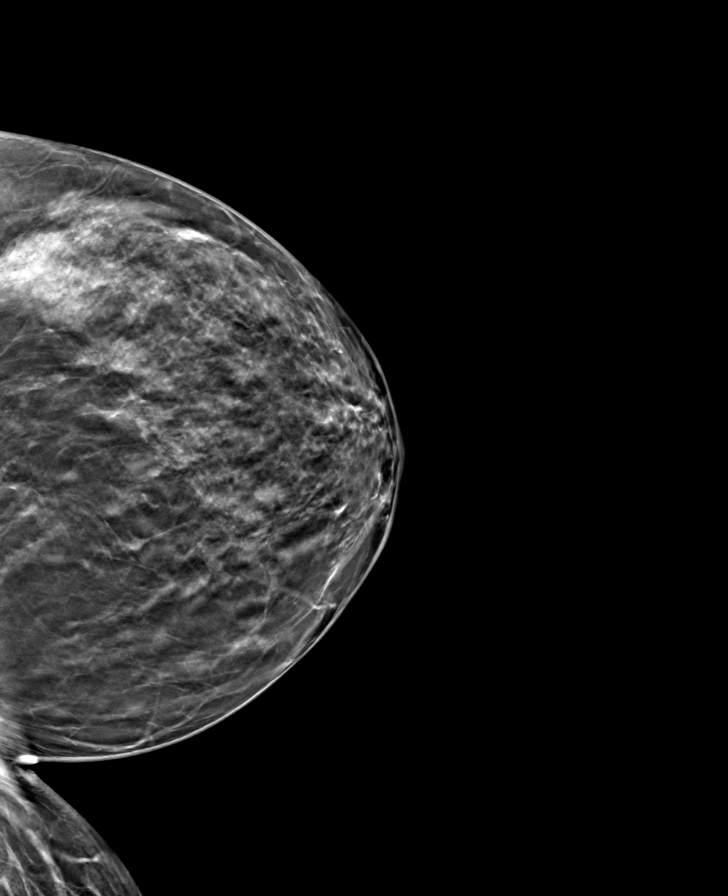

[8 of 24 positions shown; findings below may reference images not displayed]

ACR Breast Density Category c: The breast tissue is heterogeneously
dense, which may obscure small masses.
FINDINGS: There are no findings suspicious for malignancy.
IMPRESSION: No mammographic evidence of malignancy. A result letter of this
screening mammogram will be mailed directly to the patient.

RECOMMENDATION:
Screening mammogram in one year. (Code:[id number])

BI-RADS CATEGORY  1: Negative.

## 2023-03-07 ENCOUNTER — Other Ambulatory Visit: Payer: Self-pay

## 2023-03-07 ENCOUNTER — Telehealth: Payer: 59 | Admitting: Emergency Medicine

## 2023-03-07 DIAGNOSIS — B359 Dermatophytosis, unspecified: Secondary | ICD-10-CM

## 2023-03-07 MED ORDER — CLOTRIMAZOLE 1 % EX CREA
TOPICAL_CREAM | CUTANEOUS | 0 refills | Status: DC
  Filled 2023-03-07: qty 28, 14d supply, fill #0

## 2023-03-07 NOTE — Progress Notes (Signed)
E Visit for Rash  We are sorry that you are not feeling well. Here is how we plan to help!   This looks like a fungal rash.  I'd start by treating with an antifungal cream, which I'll send to your pharmacy.  Apply morning and evening and continue for about a week after the rash clears.  If you don't notice any improvement in 1 week, you should be seen again.   HOME CARE:  Take cool showers and avoid direct sunlight. Apply cool compress or wet dressings. Take a bath in an oatmeal bath.  Sprinkle content of one Aveeno packet under running faucet with comfortably warm water.  Bathe for 15-20 minutes, 1-2 times daily.  Pat dry with a towel. Do not rub the rash. Use hydrocortisone cream. Take an antihistamine like Benadryl for widespread rashes that itch.  The adult dose of Benadryl is 25-50 mg by mouth 4 times daily. Caution:  This type of medication may cause sleepiness.  Do not drink alcohol, drive, or operate dangerous machinery while taking antihistamines.  Do not take these medications if you have prostate enlargement.  Read package instructions thoroughly on all medications that you take.  GET HELP RIGHT AWAY IF:  Symptoms don't go away after treatment. Severe itching that persists. If you rash spreads or swells. If you rash begins to smell. If it blisters and opens or develops a yellow-brown crust. You develop a fever. You have a sore throat. You become short of breath.  MAKE SURE YOU:  Understand these instructions. Will watch your condition. Will get help right away if you are not doing well or get worse.  Thank you for choosing an e-visit.  Your e-visit answers were reviewed by a board certified advanced clinical practitioner to complete your personal care plan. Depending upon the condition, your plan could have included both over the counter or prescription medications.  Please review your pharmacy choice. Make sure the pharmacy is open so you can pick up prescription  now. If there is a problem, you may contact your provider through [employer] and have the prescription routed to another pharmacy.  Your safety is important to [place]. If you have drug allergies check your prescription carefully.   For the next 24 hours you can use MyChart to ask questions about today's visit, request a non-urgent call back, or ask for a work or school excuse. You will get an email in the next two days asking about your experience. I hope that your e-visit has been valuable and will speed your recovery.  Approximately 5 minutes was used in reviewing the patient's chart, questionnaire, prescribing medications, and documentation.

## 2023-04-26 ENCOUNTER — Other Ambulatory Visit: Payer: Self-pay

## 2023-04-26 ENCOUNTER — Other Ambulatory Visit: Payer: Self-pay | Admitting: Nurse Practitioner

## 2023-04-26 DIAGNOSIS — F439 Reaction to severe stress, unspecified: Secondary | ICD-10-CM

## 2023-04-27 ENCOUNTER — Other Ambulatory Visit: Payer: Self-pay

## 2023-04-27 MED ORDER — VENLAFAXINE HCL ER 75 MG PO CP24
75.0000 mg | ORAL_CAPSULE | Freq: Every day | ORAL | 0 refills | Status: DC
  Filled 2023-04-27: qty 90, 90d supply, fill #0

## 2023-04-27 NOTE — Telephone Encounter (Signed)
Called patient to schedule appt for medication refills. Patient requesting my chart VV due to work. My chart VV scheduled for 05/09/23

## 2023-04-27 NOTE — Telephone Encounter (Signed)
Requested by interface surescripts. Courtesy refill . Future VV in 1 week. Requested Prescriptions  Pending Prescriptions Disp Refills   venlafaxine XR (EFFEXOR-XR) 75 MG 24 hr capsule 90 capsule 0    Sig: Take 1 capsule (75 mg total) by mouth daily with breakfast.     Psychiatry: Antidepressants - SNRI - desvenlafaxine & venlafaxine Failed - 04/26/2023  8:12 AM      Failed - Cr in normal range and within 360 days    Creatinine, Ser  Date Value Ref Range Status  10/16/2021 0.74 0.57 - 1.00 mg/dL Final         Failed - Valid encounter within last 6 months    Recent Outpatient Visits           9 months ago Anxiety   [facility] Larae Grooms, NP   1 year ago Urge incontinence of urine   [place] [facility] Vigg, Avanti, MD   1 year ago Urgency of urination   [place] [facility] Vigg, Avanti, MD   1 year ago Annual physical exam   [facility] Larae Grooms, NP   1 year ago Annual physical exam   [place] [facility] Larae Grooms, NP       Future Appointments             In 1 week Larae Grooms, NP [facility]            Failed - Lipid Panel in normal range within the last 12 months    Cholesterol, Total  Date Value Ref Range Status  10/16/2021 200 (H) 100 - 199 mg/dL Final   LDL Chol Calc (NIH)  Date Value Ref Range Status  10/16/2021 126 (H) 0 - 99 mg/dL Final   LDL Direct  Date Value Ref Range Status  11/13/2019 117 (H) 0 - 99 mg/dL Final   HDL  Date Value Ref Range Status  10/16/2021 61 >39 mg/dL Final   Triglycerides  Date Value Ref Range Status  10/16/2021 70 0 - 149 mg/dL Final         Passed - Last BP in normal range    BP Readings from Last 1 Encounters:  11/24/21 116/79

## 2023-05-09 ENCOUNTER — Encounter: Payer: Self-pay | Admitting: Nurse Practitioner

## 2023-05-09 ENCOUNTER — Other Ambulatory Visit: Payer: Self-pay

## 2023-05-09 ENCOUNTER — Telehealth (INDEPENDENT_AMBULATORY_CARE_PROVIDER_SITE_OTHER): Payer: 59 | Admitting: Nurse Practitioner

## 2023-05-09 VITALS — Ht 63.0 in | Wt 144.0 lb

## 2023-05-09 DIAGNOSIS — F439 Reaction to severe stress, unspecified: Secondary | ICD-10-CM | POA: Diagnosis not present

## 2023-05-09 DIAGNOSIS — F419 Anxiety disorder, unspecified: Secondary | ICD-10-CM

## 2023-05-09 MED ORDER — VENLAFAXINE HCL ER 150 MG PO CP24
150.0000 mg | ORAL_CAPSULE | Freq: Every day | ORAL | 0 refills | Status: DC
  Filled 2023-05-09: qty 90, 90d supply, fill #0

## 2023-05-09 NOTE — Progress Notes (Signed)
Appointment has been made

## 2023-05-09 NOTE — Progress Notes (Signed)
Ht 5\' 3"  (1.6 m)   Wt 144 lb (65.3 kg)   BMI 25.51 kg/m    Subjective:    Patient ID: Erica Fowler, female    DOB: Jan 08, 1976, 47 y.o.   MRN: [id number]  HPI: Erica Fowler is a 47 y.o. female  Chief Complaint  Patient presents with   Medication Refill    Effoexor xr   DEPRESSION/ANXIETY Patient presents to clinic for a medication refill.  Patient states she is having trouble sleeping.  She feels like she is having exacerbated carpal tunnel.  She does feel like she has been more on edge lately and people around her have noticed it as well.     Flowsheet Row Video Visit from 05/09/2023 in [facility]  PHQ-9 Total Score 4         05/09/2023    9:56 AM 07/23/2022   11:04 AM 11/24/2021    4:09 PM 11/11/2021    2:29 PM  GAD 7 : Generalized Anxiety Score  Nervous, Anxious, on Edge 1 1 0 0  Control/stop worrying 1 0 0 1  Worry too much - different things 0 0 0 1  Trouble relaxing 1 0 0 0  Restless 0 0 0 0  Easily annoyed or irritable 2 0 0 0  Afraid - awful might happen 0 0 0 0  Total GAD 7 Score 5 1 0 2  Anxiety Difficulty  Not difficult at all Not difficult at all Not difficult at all      Relevant past medical, surgical, family and social history reviewed and updated as indicated. Interim medical history since our last visit reviewed. Allergies and medications reviewed and updated.  Review of Systems  Psychiatric/Behavioral:  Positive for dysphoric mood. Negative for suicidal ideas. The patient is nervous/anxious.     Per HPI unless specifically indicated above     Objective:    Ht 5\' 3"  (1.6 m)   Wt 144 lb (65.3 kg)   BMI 25.51 kg/m   Wt Readings from Last 3 Encounters:  05/09/23 144 lb (65.3 kg)  11/24/21 146 lb 6.4 oz (66.4 kg)  11/11/21 145 lb 6.4 oz (66 kg)    Physical Exam Vitals and nursing note reviewed.  HENT:     Head: Normocephalic.     Right Ear: Hearing normal.     Left Ear: Hearing normal.     Nose: Nose  normal.  Eyes:     Pupils: Pupils are equal, round, and reactive to light.  Pulmonary:     Effort: Pulmonary effort is normal. No respiratory distress.  Neurological:     Mental Status: She is alert.  Psychiatric:        Mood and Affect: Mood normal.        Behavior: Behavior normal.        Thought Content: Thought content normal.        Judgment: Judgment normal.     Results for orders placed or performed during the hospital encounter of 10/26/22  [facility] Molecular Screen- Blood  Result Value Ref Range   Genetic Analysis Overall Interpretation Negative    Genetic Disease Assessed      [facility] Tier One Population Screen is a screening test that analyzes 11 genes related to hereditary breast and ovarian cancer (HBOC) syndrome, Lynch syndrome, and familial hypercholesterolemia. This test only reports clinically significant pathogenic and  likely pathogenic variants, unlike diagnostic testing, which also reports variants of uncertain significance (VUS). In  addition, analysis of the PMS2 gene excludes exons 11-15, which overlap with a known pseudogene (PMS2CL).    Genetic Analysis Report      No pathogenic or likely pathogenic variants were detected in the genes analyzed by this test.Genetic test results should be interpreted in the context of an individual's personal medical and family history. Alteration to medical management is NOT  recommended based solely on this result. Clinical correlation is advised.Additional Considerations- This is a screening test; individuals may still carry pathogenic or likely pathogenic variant(s) in the tested genes that are not detected by this test.-  For individuals at risk for these or other related conditions based on factors including personal or family history, diagnostic testing is recommended.- The absence of pathogenic or likely pathogenic variant(s) in the analyzed genes, while reassuring,  does not eliminate the possibility of a hereditary condition;  there are other variants and genes associated with heart disease and hereditary cancer that are not included in this test.    Genes Tested Reported    Disclaimer Reported    Sequencing Location Reported    Interpretation Methods and Limitations Reported       Assessment & Plan:   Problem List Items Addressed This Visit       Other   Anxiety - Primary    Chronic.  Doing okay but not great.  Will increase her dose of Effexor to 150mg  daily.  Follow up in 3 months.  Call sooner if concerns arise.       Relevant Medications   venlafaxine XR (EFFEXOR-XR) 150 MG 24 hr capsule   Other Visit Diagnoses     Situational stress       Relevant Medications   venlafaxine XR (EFFEXOR-XR) 150 MG 24 hr capsule         Follow up plan: Return in about 3 months (around 08/07/2023) for Physical and Fasting labs.  This visit was completed via MyChart due to the restrictions of the COVID-19 pandemic. All issues as above were discussed and addressed. Physical exam was done as above through visual confirmation on MyChart. If it was felt that the patient should be evaluated in the office, they were directed there. The patient verbally consented to this visit. Location of the patient: Home Location of the provider: Office Those involved with this call:  Provider: Larae Grooms, NP CMA: Oswaldo Conroy, CMA Front Desk/Registration: Servando Snare  This encounter was conducted via video.  I spent 30 mins dedicated to the care of this patient on the date of this encounter to include previsit review of symptoms plan of care, and follow up, face to face time with the patient, and post visit ordering of testing.

## 2023-05-09 NOTE — Assessment & Plan Note (Signed)
Chronic.  Doing okay but not great.  Will increase her dose of Effexor to 150mg  daily.  Follow up in 3 months.  Call sooner if concerns arise.

## 2023-05-26 DIAGNOSIS — H524 Presbyopia: Secondary | ICD-10-CM | POA: Diagnosis not present

## 2023-05-26 DIAGNOSIS — H5032 Intermittent alternating esotropia: Secondary | ICD-10-CM | POA: Diagnosis not present

## 2023-05-26 DIAGNOSIS — H532 Diplopia: Secondary | ICD-10-CM | POA: Diagnosis not present

## 2023-08-12 ENCOUNTER — Other Ambulatory Visit: Payer: Self-pay

## 2023-08-12 ENCOUNTER — Ambulatory Visit: Payer: 59 | Admitting: Nurse Practitioner

## 2023-08-12 VITALS — BP 111/74 | HR 93 | Temp 98.0°F | Ht 63.3 in | Wt 143.2 lb

## 2023-08-12 DIAGNOSIS — J301 Allergic rhinitis due to pollen: Secondary | ICD-10-CM

## 2023-08-12 DIAGNOSIS — Z1231 Encounter for screening mammogram for malignant neoplasm of breast: Secondary | ICD-10-CM | POA: Diagnosis not present

## 2023-08-12 DIAGNOSIS — F439 Reaction to severe stress, unspecified: Secondary | ICD-10-CM

## 2023-08-12 DIAGNOSIS — Z0000 Encounter for general adult medical examination without abnormal findings: Secondary | ICD-10-CM | POA: Diagnosis not present

## 2023-08-12 DIAGNOSIS — F419 Anxiety disorder, unspecified: Secondary | ICD-10-CM | POA: Diagnosis not present

## 2023-08-12 DIAGNOSIS — E559 Vitamin D deficiency, unspecified: Secondary | ICD-10-CM | POA: Diagnosis not present

## 2023-08-12 DIAGNOSIS — Z136 Encounter for screening for cardiovascular disorders: Secondary | ICD-10-CM | POA: Diagnosis not present

## 2023-08-12 DIAGNOSIS — Z1211 Encounter for screening for malignant neoplasm of colon: Secondary | ICD-10-CM | POA: Diagnosis not present

## 2023-08-12 DIAGNOSIS — R7309 Other abnormal glucose: Secondary | ICD-10-CM | POA: Diagnosis not present

## 2023-08-12 LAB — URINALYSIS, ROUTINE W REFLEX MICROSCOPIC
Bilirubin, UA: NEGATIVE
Glucose, UA: NEGATIVE
Ketones, UA: NEGATIVE
Leukocytes,UA: NEGATIVE
Nitrite, UA: NEGATIVE
Protein,UA: NEGATIVE
RBC, UA: NEGATIVE
Specific Gravity, UA: 1.02 (ref 1.005–1.030)
Urobilinogen, Ur: 1 mg/dL (ref 0.2–1.0)
pH, UA: 7 (ref 5.0–7.5)

## 2023-08-12 MED ORDER — FLUTICASONE PROPIONATE 50 MCG/ACT NA SUSP
2.0000 | Freq: Every day | NASAL | 6 refills | Status: DC
Start: 2023-08-12 — End: 2024-04-02
  Filled 2023-08-12: qty 16, 30d supply, fill #0

## 2023-08-12 MED ORDER — VENLAFAXINE HCL ER 150 MG PO CP24
150.0000 mg | ORAL_CAPSULE | Freq: Every day | ORAL | 1 refills | Status: DC
Start: 2023-08-12 — End: 2024-04-05
  Filled 2023-08-12: qty 90, 90d supply, fill #0
  Filled 2023-12-14: qty 90, 90d supply, fill #1

## 2023-08-12 NOTE — Progress Notes (Signed)
 BP 111/74   Pulse 93   Temp 98 F (36.7 C) (Oral)   Ht 5' 3.3" (1.608 m)   Wt 143 lb 3.2 oz (65 kg)   SpO2 98%   BMI 25.13 kg/m    Subjective:    Patient ID: Erica Fowler, female    DOB: [birth year], 48 y.o.   MRN: [id number]  HPI: Erica Fowler is a 48 y.o. female presenting on 08/12/2023 for comprehensive medical examination. Current medical complaints include: Anxiety  She currently lives with: Menopausal Symptoms: no  SYNCOPAL EPISODE Patient states she has a syncopal episode in April.  It was found to be dehydration.  She wanted it noted in her medical record.    Patient quit smoking about 206 days ago.    ANXIETY Patient states she feels like her anxiety has been better.  It does fluctuate at times.  Feels like the effexor is working well for her.  Denies SI.   Depression Screen done today and results listed below:     08/12/2023   10:25 AM 05/09/2023    9:57 AM 07/23/2022   11:02 AM 11/24/2021    4:09 PM 11/11/2021    2:28 PM  Depression screen PHQ 2/9  Decreased Interest 1 1 0 0   Down, Depressed, Hopeless 0 0 0 0 0  PHQ - 2 Score 1 1 0 0 0  Altered sleeping 2 1 2 2  0  Tired, decreased energy 2 1 1 1 2   Change in appetite 0 0 0 0 1  Feeling bad or failure about yourself  0 0 0 0 0  Trouble concentrating 0 1 0 0 0  Moving slowly or fidgety/restless 0 0 0 0 0  Suicidal thoughts 0 0 0 0 0  PHQ-9 Score 5 4 3 3 3   Difficult doing work/chores Not difficult at all  Not difficult at all Not difficult at all Not difficult at all    The patient does not have a history of falls. I did complete a risk assessment for falls. A plan of care for falls was documented.   Past Medical History:  Past Medical History:  Diagnosis Date   Allergy    Anxiety    GERD (gastroesophageal reflux disease)    Headache     Surgical History:  Past Surgical History:  Procedure Laterality Date   CESAREAN SECTION  1997   LAPAROTOMY Left 06/07/2008    Medications:  Current  Outpatient Medications on File Prior to Visit  Medication Sig   acetaminophen (TYLENOL) 500 MG tablet Take 500 mg by mouth every 6 (six) hours as needed.   clotrimazole (LOTRIMIN) 1 % cream Apply 1 application topically every morning and evening until 1 week after rash clears.   diphenhydrAMINE (BENADRYL) 25 mg capsule Take 25 mg by mouth every 6 (six) hours as needed.   fexofenadine (ALLEGRA) 180 MG tablet Take 180 mg by mouth daily.   levonorgestrel (MIRENA) 20 MCG/24HR IUD 1 each by Intrauterine route once.   Pseudoephedrine HCl (SUDAFED PO) Take by mouth.   vitamin B-12 (CYANOCOBALAMIN) 1000 MCG tablet Take 1,000 mcg by mouth daily.   No current facility-administered medications on file prior to visit.    Allergies:  Allergies  Allergen Reactions   Sulfa Antibiotics Other (See Comments)    Childhood allergy per patient    Social History:  Social History   Socioeconomic History   Marital status: Married    Spouse name: Not on file  Number of children: Not on file   Years of education: Not on file   Highest education level: Master's degree (e.g., MA, MS, MEng, MEd, MSW, MBA)  Occupational History   Not on file  Tobacco Use   Smoking status: Former    Current packs/day: 0.50    Average packs/day: 0.5 packs/day for 20.0 years (10.0 ttl pk-yrs)    Types: Cigarettes   Smokeless tobacco: Never  Vaping Use   Vaping status: Every Day  Substance and Sexual Activity   Alcohol use: Yes    Comment: occasional   Drug use: No   Sexual activity: Yes    Birth control/protection: I.U.D.    Comment: mirena  Other Topics Concern   Not on file  Social History Narrative   Not on file   Social Drivers of Health   Financial Resource Strain: Low Risk  (08/12/2023)   Overall Financial Resource Strain (CARDIA)    Difficulty of Paying Living Expenses: Not hard at all  Food Insecurity: No Food Insecurity (08/12/2023)   Hunger Vital Sign    Worried About Running Out of Food in the Last  Year: Never true    Ran Out of Food in the Last Year: Never true  Transportation Needs: No Transportation Needs (08/12/2023)   PRAPARE - Administrator, Civil Service (Medical): No    Lack of Transportation (Non-Medical): No  Physical Activity: Inactive (08/12/2023)   Exercise Vital Sign    Days of Exercise per Week: 0 days    Minutes of Exercise per Session: 0 min  Stress: Stress Concern Present (08/12/2023)   [employer] of Occupational Health - Occupational Stress Questionnaire    Feeling of Stress : To some extent  Social Connections: Socially Isolated (08/12/2023)   Social Connection and Isolation Panel [NHANES]    Frequency of Communication with Friends and Family: Once a week    Frequency of Social Gatherings with Friends and Family: Once a week    Attends Religious Services: Never    Database administrator or Organizations: No    Attends Engineer, structural: Not on file    Marital Status: Married  Catering manager Violence: Not At Risk (08/12/2023)   Humiliation, Afraid, Rape, and Kick questionnaire    Fear of Current or Ex-Partner: No    Emotionally Abused: No    Physically Abused: No    Sexually Abused: No   Social History   Tobacco Use  Smoking Status Former   Current packs/day: 0.50   Average packs/day: 0.5 packs/day for 20.0 years (10.0 ttl pk-yrs)   Types: Cigarettes  Smokeless Tobacco Never   Social History   Substance and Sexual Activity  Alcohol Use Yes   Comment: occasional    Family History:  Family History  Problem Relation Age of Onset   COPD Mother    Heart disease Father    Cancer Father        Prostate   Autoimmune disease Sister    Breast cancer Paternal Aunt    COPD Maternal Grandmother    Diabetes Maternal Grandmother    Renal Disease Maternal Grandmother    Diabetes Maternal Grandfather    Heart disease Maternal Grandfather    Hypertension Paternal Grandmother    Stroke Paternal Grandmother    Breast cancer  Paternal Grandmother    Heart disease Paternal Grandfather    Lung cancer Paternal Grandfather     Past medical history, surgical history, medications, allergies, family history and social history  reviewed with patient today and changes made to appropriate areas of the chart.   Review of Systems  Psychiatric/Behavioral:  Positive for depression. Negative for suicidal ideas. The patient is nervous/anxious.    All other ROS negative except what is listed above and in the HPI.      Objective:    BP 111/74   Pulse 93   Temp 98 F (36.7 C) (Oral)   Ht 5' 3.3" (1.608 m)   Wt 143 lb 3.2 oz (65 kg)   SpO2 98%   BMI 25.13 kg/m   Wt Readings from Last 3 Encounters:  08/12/23 143 lb 3.2 oz (65 kg)  05/09/23 144 lb (65.3 kg)  11/24/21 146 lb 6.4 oz (66.4 kg)    Physical Exam Vitals and nursing note reviewed.  Constitutional:      General: She is awake. She is not in acute distress.    Appearance: Normal appearance. She is well-developed. She is not ill-appearing.  HENT:     Head: Normocephalic and atraumatic.     Right Ear: Hearing, tympanic membrane, ear canal and external ear normal. No drainage.     Left Ear: Hearing, tympanic membrane, ear canal and external ear normal. No drainage.     Nose: Nose normal.     Right Sinus: No maxillary sinus tenderness or frontal sinus tenderness.     Left Sinus: No maxillary sinus tenderness or frontal sinus tenderness.     Mouth/Throat:     Mouth: Mucous membranes are moist.     Pharynx: Oropharynx is clear. Uvula midline. No pharyngeal swelling, oropharyngeal exudate or posterior oropharyngeal erythema.  Eyes:     General: Lids are normal.        Right eye: No discharge.        Left eye: No discharge.     Extraocular Movements: Extraocular movements intact.     Conjunctiva/sclera: Conjunctivae normal.     Pupils: Pupils are equal, round, and reactive to light.     Visual Fields: Right eye visual fields normal and left eye visual fields  normal.  Neck:     Thyroid: No thyromegaly.     Vascular: No carotid bruit.     Trachea: Trachea normal.  Cardiovascular:     Rate and Rhythm: Normal rate and regular rhythm.     Heart sounds: Normal heart sounds. No murmur heard.    No gallop.  Pulmonary:     Effort: Pulmonary effort is normal. No accessory muscle usage or respiratory distress.     Breath sounds: Normal breath sounds.  Chest:  Breasts:    Right: Normal.     Left: Normal.  Abdominal:     General: Bowel sounds are normal.     Palpations: Abdomen is soft. There is no hepatomegaly or splenomegaly.     Tenderness: There is no abdominal tenderness.  Musculoskeletal:        General: Normal range of motion.     Cervical back: Normal range of motion and neck supple.     Right lower leg: No edema.     Left lower leg: No edema.  Lymphadenopathy:     Head:     Right side of head: No submental, submandibular, tonsillar, preauricular or posterior auricular adenopathy.     Left side of head: No submental, submandibular, tonsillar, preauricular or posterior auricular adenopathy.     Cervical: No cervical adenopathy.     Upper Body:     Right upper body: No supraclavicular, axillary or  pectoral adenopathy.     Left upper body: No supraclavicular, axillary or pectoral adenopathy.  Skin:    General: Skin is warm and dry.     Capillary Refill: Capillary refill takes less than 2 seconds.     Findings: No rash.  Neurological:     Mental Status: She is alert and oriented to person, place, and time.     Gait: Gait is intact.  Psychiatric:        Attention and Perception: Attention normal.        Mood and Affect: Mood normal.        Speech: Speech normal.        Behavior: Behavior normal. Behavior is cooperative.        Thought Content: Thought content normal.        Judgment: Judgment normal.     Results for orders placed or performed during the hospital encounter of 10/26/22  [facility] Molecular Screen- Blood   Collection  Time: 10/26/22  3:49 PM  Result Value Ref Range   Genetic Analysis Overall Interpretation Negative    Genetic Disease Assessed      [facility] Population Screen is a screening test that analyzes 11 genes related to hereditary breast and ovarian cancer (HBOC) syndrome, Lynch syndrome, and familial hypercholesterolemia. This test only reports clinically significant pathogenic and  likely pathogenic variants, unlike diagnostic testing, which also reports variants of uncertain significance (VUS). In addition, analysis of the PMS2 gene excludes exons 11-15, which overlap with a known pseudogene (PMS2CL).    Genetic Analysis Report      No pathogenic or likely pathogenic variants were detected in the genes analyzed by this test.Genetic test results should be interpreted in the context of an individual's personal medical and family history. Alteration to medical management is NOT  recommended based solely on this result. Clinical correlation is advised.Additional Considerations- This is a screening test; individuals may still carry pathogenic or likely pathogenic variant(s) in the tested genes that are not detected by this test.-  For individuals at risk for these or other related conditions based on factors including personal or family history, diagnostic testing is recommended.- The absence of pathogenic or likely pathogenic variant(s) in the analyzed genes, while reassuring,  does not eliminate the possibility of a hereditary condition; there are other variants and genes associated with heart disease and hereditary cancer that are not included in this test.    Genes Tested Reported    Disclaimer Reported    Sequencing Location Reported    Interpretation Methods and Limitations Reported       Assessment & Plan:   Problem List Items Addressed This Visit       Other   Anxiety   Chronic.  Controlled.  Continue with current medication regimen of Effexor.  Refills sent today.  Labs ordered today.   Return to clinic in 6 months for reevaluation.  Call sooner if concerns arise.        Relevant Medications   venlafaxine XR (EFFEXOR-XR) 150 MG 24 hr capsule   Other Visit Diagnoses       Annual physical exam    -  Primary   Health maintenance reviewed during visit today.  Labs ordered.  Vaccines reviewed.  PAP up to date.  Cologuard ordered. Mammogram ordered.   Relevant Orders   CBC with Differential/Platelet   Comprehensive metabolic panel   Lipid panel   TSH   Urinalysis, Routine w reflex microscopic   Hemoglobin A1c  Elevated glucose       Relevant Orders   Hemoglobin A1c     Screening for ischemic heart disease       Relevant Orders   Lipid panel     Allergic rhinitis due to pollen, unspecified seasonality       Relevant Medications   fluticasone (FLONASE) 50 MCG/ACT nasal spray     Situational stress       Relevant Medications   venlafaxine XR (EFFEXOR-XR) 150 MG 24 hr capsule     Screening for colon cancer       Relevant Orders   Cologuard     Encounter for screening mammogram for malignant neoplasm of breast       Relevant Orders   MM 3D SCREENING MAMMOGRAM BILATERAL BREAST     Vitamin D deficiency       Relevant Orders   Vitamin D (25 hydroxy)        Follow up plan: Return in about 6 months (around 02/12/2024) for Depression/Anxiety FU.   LABORATORY TESTING:  - Pap smear: up to date  IMMUNIZATIONS:   - Tdap: Tetanus vaccination status reviewed: last tetanus booster within 10 years. - Influenza: Up to date - Pneumovax: Not applicable - Prevnar: NA - COVID: Up to date - HPV: Not applicable - Shingrix vaccine: Not applicable  SCREENING: -Mammogram: Ordered today  - Colonoscopy: Ordered today  - Bone Density: Not applicable  -Hearing Test: Not applicable  -Spirometry: Not applicable   PATIENT COUNSELING:   Advised to take 1 mg of folate supplement per day if capable of pregnancy.   Sexuality: Discussed sexually transmitted diseases, partner  selection, use of condoms, avoidance of unintended pregnancy  and contraceptive alternatives.   Advised to avoid cigarette smoking.  I discussed with the patient that most people either abstain from alcohol or drink within safe limits (<=14/week and <=4 drinks/occasion for males, <=7/weeks and <= 3 drinks/occasion for females) and that the risk for alcohol disorders and other health effects rises proportionally with the number of drinks per week and how often a drinker exceeds daily limits.  Discussed cessation/primary prevention of drug use and availability of treatment for abuse.   Diet: Encouraged to adjust caloric intake to maintain  or achieve ideal body weight, to reduce intake of dietary saturated fat and total fat, to limit sodium intake by avoiding high sodium foods and not adding table salt, and to maintain adequate dietary potassium and calcium preferably from fresh fruits, vegetables, and low-fat dairy products.    stressed the importance of regular exercise  Injury prevention: Discussed safety belts, safety helmets, smoke detector, smoking near bedding or upholstery.   Dental health: Discussed importance of regular tooth brushing, flossing, and dental visits.    NEXT PREVENTATIVE PHYSICAL DUE IN 1 YEAR. Return in about 6 months (around 02/12/2024) for Depression/Anxiety FU.

## 2023-08-12 NOTE — Assessment & Plan Note (Signed)
 Chronic.  Controlled.  Continue with current medication regimen of Effexor.  Refills sent today.  Labs ordered today.  Return to clinic in 6 months for reevaluation.  Call sooner if concerns arise.

## 2023-08-13 LAB — LIPID PANEL
Chol/HDL Ratio: 3.8 ratio (ref 0.0–4.4)
Cholesterol, Total: 237 mg/dL — ABNORMAL HIGH (ref 100–199)
HDL: 62 mg/dL (ref >39)
LDL Chol Calc (NIH): 163 mg/dL — ABNORMAL HIGH (ref 0–99)
Triglycerides: 71 mg/dL (ref 0–149)
VLDL Cholesterol Cal: 12 mg/dL (ref 5–40)

## 2023-08-13 LAB — CBC WITH DIFFERENTIAL/PLATELET
Basophils Absolute: 0.1 10*3/uL (ref 0.0–0.2)
Basos: 1 %
EOS (ABSOLUTE): 0.2 10*3/uL (ref 0.0–0.4)
Eos: 3 %
Hematocrit: 43 % (ref 34.0–46.6)
Hemoglobin: 14.2 g/dL (ref 11.1–15.9)
Immature Grans (Abs): 0 10*3/uL (ref 0.0–0.1)
Immature Granulocytes: 0 %
Lymphocytes Absolute: 2.1 10*3/uL (ref 0.7–3.1)
Lymphs: 30 %
MCH: 29.8 pg (ref 26.6–33.0)
MCHC: 33 g/dL (ref 31.5–35.7)
MCV: 90 fL (ref 79–97)
Monocytes Absolute: 0.7 10*3/uL (ref 0.1–0.9)
Monocytes: 10 %
Neutrophils Absolute: 4 10*3/uL (ref 1.4–7.0)
Neutrophils: 56 %
Platelets: 318 10*3/uL (ref 150–450)
RBC: 4.77 x10E6/uL (ref 3.77–5.28)
RDW: 11.8 % (ref 11.7–15.4)
WBC: 7.1 10*3/uL (ref 3.4–10.8)

## 2023-08-13 LAB — COMPREHENSIVE METABOLIC PANEL
ALT: 22 IU/L (ref 0–32)
AST: 23 IU/L (ref 0–40)
Albumin: 4.7 g/dL (ref 3.9–4.9)
Alkaline Phosphatase: 50 IU/L (ref 44–121)
BUN/Creatinine Ratio: 19 (ref 9–23)
BUN: 13 mg/dL (ref 6–24)
Bilirubin Total: 0.3 mg/dL (ref 0.0–1.2)
CO2: 24 mmol/L (ref 20–29)
Calcium: 9.7 mg/dL (ref 8.7–10.2)
Chloride: 99 mmol/L (ref 96–106)
Creatinine, Ser: 0.69 mg/dL (ref 0.57–1.00)
Globulin, Total: 2.4 g/dL (ref 1.5–4.5)
Glucose: 78 mg/dL (ref 70–99)
Potassium: 4.1 mmol/L (ref 3.5–5.2)
Sodium: 137 mmol/L (ref 134–144)
Total Protein: 7.1 g/dL (ref 6.0–8.5)
eGFR: 108 mL/min/{1.73_m2} (ref >59)

## 2023-08-13 LAB — HEMOGLOBIN A1C
Est. average glucose Bld gHb Est-mCnc: 108 mg/dL
Hgb A1c MFr Bld: 5.4 % (ref 4.8–5.6)

## 2023-08-13 LAB — TSH: TSH: 0.81 u[IU]/mL (ref 0.450–4.500)

## 2023-08-13 LAB — VITAMIN D 25 HYDROXY (VIT D DEFICIENCY, FRACTURES): Vit D, 25-Hydroxy: 20.5 ng/mL — ABNORMAL LOW (ref 30.0–100.0)

## 2023-08-15 ENCOUNTER — Other Ambulatory Visit: Payer: Self-pay

## 2023-08-15 ENCOUNTER — Encounter: Payer: Self-pay | Admitting: Nurse Practitioner

## 2023-08-15 MED ORDER — VITAMIN D (ERGOCALCIFEROL) 1.25 MG (50000 UNIT) PO CAPS
50000.0000 [IU] | ORAL_CAPSULE | ORAL | 0 refills | Status: DC
  Filled 2023-08-15: qty 12, 84d supply, fill #0

## 2023-08-15 NOTE — Addendum Note (Signed)
 Addended by: Larae Grooms on: 08/15/2023 08:20 AM   Modules accepted: Orders

## 2023-08-22 DIAGNOSIS — Z1211 Encounter for screening for malignant neoplasm of colon: Secondary | ICD-10-CM | POA: Diagnosis not present

## 2023-08-26 LAB — COLOGUARD: COLOGUARD: NEGATIVE

## 2023-09-26 ENCOUNTER — Encounter (INDEPENDENT_AMBULATORY_CARE_PROVIDER_SITE_OTHER): Payer: Self-pay

## 2024-03-22 ENCOUNTER — Inpatient Hospital Stay
Admission: EM | Admit: 2024-03-22 | Discharge: 2024-03-26 | DRG: 060 | Disposition: A | Discharge status: Home / Self Care | Source: Ambulatory Visit | Attending: Internal Medicine | Admitting: Internal Medicine

## 2024-03-22 ENCOUNTER — Encounter: Payer: Self-pay | Admitting: Internal Medicine

## 2024-03-22 ENCOUNTER — Ambulatory Visit: Admission: EM | Admit: 2024-03-22 | Discharge: 2024-03-22 | Disposition: A | Discharge status: Home / Self Care

## 2024-03-22 ENCOUNTER — Emergency Department

## 2024-03-22 ENCOUNTER — Encounter: Payer: Self-pay | Admitting: Emergency Medicine

## 2024-03-22 ENCOUNTER — Other Ambulatory Visit: Payer: Self-pay

## 2024-03-22 ENCOUNTER — Telehealth: Payer: Self-pay | Admitting: Nurse Practitioner

## 2024-03-22 DIAGNOSIS — K219 Gastro-esophageal reflux disease without esophagitis: Secondary | ICD-10-CM | POA: Diagnosis present

## 2024-03-22 DIAGNOSIS — E876 Hypokalemia: Secondary | ICD-10-CM | POA: Diagnosis not present

## 2024-03-22 DIAGNOSIS — G35D Multiple sclerosis, unspecified: Secondary | ICD-10-CM | POA: Diagnosis not present

## 2024-03-22 DIAGNOSIS — Z825 Family history of asthma and other chronic lower respiratory diseases: Secondary | ICD-10-CM

## 2024-03-22 DIAGNOSIS — Z803 Family history of malignant neoplasm of breast: Secondary | ICD-10-CM

## 2024-03-22 DIAGNOSIS — F419 Anxiety disorder, unspecified: Secondary | ICD-10-CM | POA: Diagnosis present

## 2024-03-22 DIAGNOSIS — R42 Dizziness and giddiness: Secondary | ICD-10-CM

## 2024-03-22 DIAGNOSIS — M47812 Spondylosis without myelopathy or radiculopathy, cervical region: Secondary | ICD-10-CM | POA: Diagnosis not present

## 2024-03-22 DIAGNOSIS — R29898 Other symptoms and signs involving the musculoskeletal system: Secondary | ICD-10-CM | POA: Diagnosis not present

## 2024-03-22 DIAGNOSIS — Z79899 Other long term (current) drug therapy: Secondary | ICD-10-CM | POA: Diagnosis not present

## 2024-03-22 DIAGNOSIS — E559 Vitamin D deficiency, unspecified: Secondary | ICD-10-CM | POA: Diagnosis not present

## 2024-03-22 DIAGNOSIS — R3915 Urgency of urination: Secondary | ICD-10-CM | POA: Diagnosis not present

## 2024-03-22 DIAGNOSIS — Z8249 Family history of ischemic heart disease and other diseases of the circulatory system: Secondary | ICD-10-CM | POA: Diagnosis not present

## 2024-03-22 DIAGNOSIS — Z823 Family history of stroke: Secondary | ICD-10-CM | POA: Diagnosis not present

## 2024-03-22 DIAGNOSIS — M4802 Spinal stenosis, cervical region: Secondary | ICD-10-CM | POA: Diagnosis not present

## 2024-03-22 DIAGNOSIS — Z833 Family history of diabetes mellitus: Secondary | ICD-10-CM | POA: Diagnosis not present

## 2024-03-22 DIAGNOSIS — Z87891 Personal history of nicotine dependence: Secondary | ICD-10-CM | POA: Diagnosis not present

## 2024-03-22 DIAGNOSIS — Z801 Family history of malignant neoplasm of trachea, bronchus and lung: Secondary | ICD-10-CM

## 2024-03-22 DIAGNOSIS — M5021 Other cervical disc displacement,  high cervical region: Secondary | ICD-10-CM | POA: Diagnosis not present

## 2024-03-22 DIAGNOSIS — Z882 Allergy status to sulfonamides status: Secondary | ICD-10-CM | POA: Diagnosis not present

## 2024-03-22 DIAGNOSIS — R29818 Other symptoms and signs involving the nervous system: Secondary | ICD-10-CM | POA: Diagnosis not present

## 2024-03-22 DIAGNOSIS — G939 Disorder of brain, unspecified: Secondary | ICD-10-CM | POA: Diagnosis not present

## 2024-03-22 DIAGNOSIS — M50221 Other cervical disc displacement at C4-C5 level: Secondary | ICD-10-CM | POA: Diagnosis not present

## 2024-03-22 LAB — COMPREHENSIVE METABOLIC PANEL WITH GFR
ALT: 21 U/L (ref 0–44)
AST: 35 U/L (ref 15–41)
Albumin: 4.7 g/dL (ref 3.5–5.0)
Alkaline Phosphatase: 43 U/L (ref 38–126)
Anion gap: 11 (ref 5–15)
BUN: 10 mg/dL (ref 6–20)
CO2: 24 mmol/L (ref 22–32)
Calcium: 9.4 mg/dL (ref 8.9–10.3)
Chloride: 103 mmol/L (ref 98–111)
Creatinine, Ser: 0.84 mg/dL (ref 0.44–1.00)
GFR, Estimated: >60 mL/min (ref >60)
Glucose, Bld: 101 mg/dL — ABNORMAL HIGH (ref 70–99)
Potassium: 3.4 mmol/L — ABNORMAL LOW (ref 3.5–5.1)
Sodium: 138 mmol/L (ref 135–145)
Total Bilirubin: 0.8 mg/dL (ref 0.0–1.2)
Total Protein: 8 g/dL (ref 6.5–8.1)

## 2024-03-22 LAB — URINALYSIS, ROUTINE W REFLEX MICROSCOPIC
Bilirubin Urine: NEGATIVE
Glucose, UA: NEGATIVE mg/dL
Hgb urine dipstick: NEGATIVE
Ketones, ur: 5 mg/dL — AB
Leukocytes,Ua: NEGATIVE
Nitrite: POSITIVE — AB
Protein, ur: NEGATIVE mg/dL
Specific Gravity, Urine: 1.014 (ref 1.005–1.030)
pH: 5 (ref 5.0–8.0)

## 2024-03-22 LAB — CBC
HCT: 43.8 % (ref 36.0–46.0)
Hemoglobin: 14.3 g/dL (ref 12.0–15.0)
MCH: 29.6 pg (ref 26.0–34.0)
MCHC: 32.6 g/dL (ref 30.0–36.0)
MCV: 90.7 fL (ref 80.0–100.0)
Platelets: 311 K/uL (ref 150–400)
RBC: 4.83 MIL/uL (ref 3.87–5.11)
RDW: 12.6 % (ref 11.5–15.5)
WBC: 6.5 K/uL (ref 4.0–10.5)
nRBC: 0 % (ref 0.0–0.2)

## 2024-03-22 LAB — CBG MONITORING, ED: Glucose-Capillary: 91 mg/dL (ref 70–99)

## 2024-03-22 LAB — POC URINE PREG, ED: Preg Test, Ur: NEGATIVE

## 2024-03-22 MED ORDER — ONDANSETRON HCL 4 MG/2ML IJ SOLN: 4.0000 mg | Freq: Four times a day (QID) | INTRAMUSCULAR | Status: DC | PRN

## 2024-03-22 MED ORDER — ENOXAPARIN SODIUM 40 MG/0.4ML IJ SOSY
40.0000 mg | PREFILLED_SYRINGE | INTRAMUSCULAR | Status: DC
  Administered 2024-03-22 – 2024-03-25 (×4): 40 mg via SUBCUTANEOUS
  Filled 2024-03-22 (×4): qty 0.4

## 2024-03-22 MED ORDER — MECLIZINE HCL 25 MG PO TABS
50.0000 mg | ORAL_TABLET | Freq: Once | ORAL | Status: AC
  Administered 2024-03-22: 50 mg via ORAL
  Filled 2024-03-22: qty 2

## 2024-03-22 MED ORDER — HYDRALAZINE HCL 20 MG/ML IJ SOLN: 5.0000 mg | Freq: Four times a day (QID) | INTRAMUSCULAR | Status: DC | PRN

## 2024-03-22 MED ORDER — VITAMIN B-12 1000 MCG PO TABS
1000.0000 ug | ORAL_TABLET | Freq: Every day | ORAL | Status: DC
  Administered 2024-03-23 – 2024-03-26 (×4): 1000 ug via ORAL
  Filled 2024-03-22 (×4): qty 1

## 2024-03-22 MED ORDER — SODIUM CHLORIDE 0.9 % IV BOLUS
1000.0000 mL | Freq: Once | INTRAVENOUS | Status: AC
  Administered 2024-03-22: 1000 mL via INTRAVENOUS

## 2024-03-22 MED ORDER — FLUTICASONE PROPIONATE 50 MCG/ACT NA SUSP
2.0000 | Freq: Every day | NASAL | Status: DC | PRN
Start: 2024-03-22 — End: 2024-03-26

## 2024-03-22 MED ORDER — ACETAMINOPHEN 325 MG PO TABS
650.0000 mg | ORAL_TABLET | Freq: Four times a day (QID) | ORAL | Status: DC | PRN
  Administered 2024-03-23 – 2024-03-25 (×4): 650 mg via ORAL
  Filled 2024-03-22 (×3): qty 2

## 2024-03-22 MED ORDER — MELATONIN 5 MG PO TABS: 5.0000 mg | ORAL_TABLET | Freq: Every evening | ORAL | Status: DC | PRN

## 2024-03-22 MED ORDER — VENLAFAXINE HCL ER 75 MG PO CP24
150.0000 mg | ORAL_CAPSULE | Freq: Every day | ORAL | Status: DC
  Administered 2024-03-23 – 2024-03-26 (×4): 150 mg via ORAL
  Filled 2024-03-22 (×4): qty 2

## 2024-03-22 MED ORDER — ACETAMINOPHEN 650 MG RE SUPP: 650.0000 mg | Freq: Four times a day (QID) | RECTAL | Status: DC | PRN

## 2024-03-22 MED ORDER — GADOBUTROL 1 MMOL/ML IV SOLN
6.0000 mL | Freq: Once | INTRAVENOUS | Status: AC | PRN
  Administered 2024-03-22: 6 mL via INTRAVENOUS

## 2024-03-22 MED ORDER — ONDANSETRON HCL 4 MG PO TABS: 4.0000 mg | ORAL_TABLET | Freq: Four times a day (QID) | ORAL | Status: DC | PRN

## 2024-03-22 MED ORDER — POTASSIUM CHLORIDE CRYS ER 20 MEQ PO TBCR
20.0000 meq | EXTENDED_RELEASE_TABLET | Freq: Once | ORAL | Status: AC
  Administered 2024-03-22: 20 meq via ORAL
  Filled 2024-03-22: qty 1

## 2024-03-22 MED ORDER — SODIUM CHLORIDE 0.9 % IV SOLN
1000.0000 mg | Freq: Every day | INTRAVENOUS | Status: DC
  Administered 2024-03-23: 1000 mg via INTRAVENOUS
  Filled 2024-03-22 (×2): qty 16

## 2024-03-22 MED ORDER — LORAZEPAM 0.5 MG PO TABS: 0.5000 mg | ORAL_TABLET | Freq: Four times a day (QID) | ORAL | Status: DC | PRN

## 2024-03-22 NOTE — Consult Note (Addendum)
 NEUROLOGY CONSULT NOTE   Date of service: March 22, 2024 Patient Name: Erica Fowler MRN:  [id number] DOB:  1975-11-15 Chief Complaint: Dizziness and gait unsteadiness Requesting Provider: Waymond Lorelle Cummins, MD  History of Present Illness  Erica Fowler is a 48 y.o. female with a PMHx of headache, GERD, anxiety and allergy who presents to the ED with dizziness and gait unsteadiness as well as difficulty concentrating on others' speech. She was unable to comprehend what people were saying to her yesterday and also stated that I just don't feel right. No pain reported. MRI in the ED revealed multifocal infratentorial and supratentorial WM lesions with a distribution and morphologies most consistent with MS; no enhancing lesions seen.   On interview with Neurology, she endorses a history of prior neurological deficits. Her first symptoms occurred about 15 years ago, with vertigo that subsequently would come and go. In 2019 she experienced new onset of numbness to her left flank region that later radiated down her left leg. Four years ago, she had new onset of binocular double vision which was evaluated and resulted in a prescription prism  glasses. She has continued to have binocular vision since then. She denies any history of monocular vision loss or eye pain. She does have a family history of MS.    ROS  Comprehensive ROS performed and pertinent positives documented in HPI    Past History   Past Medical History:  Diagnosis Date   Allergy    Anxiety    GERD (gastroesophageal reflux disease)    Headache     Past Surgical History:  Procedure Laterality Date   CESAREAN SECTION  1997   LAPAROTOMY Left 06/07/2008    Family History: Family History  Problem Relation Age of Onset   COPD Mother    Heart disease Father    Cancer Father        Prostate   Autoimmune disease Sister    Breast cancer Paternal Aunt    COPD Maternal Grandmother    Diabetes Maternal Grandmother     Renal Disease Maternal Grandmother    Diabetes Maternal Grandfather    Heart disease Maternal Grandfather    Hypertension Paternal Grandmother    Stroke Paternal Grandmother    Breast cancer Paternal Grandmother    Heart disease Paternal Grandfather    Lung cancer Paternal Grandfather     Social History  reports that she has quit smoking. Her smoking use included cigarettes. She has a 10 pack-year smoking history. She has never used smokeless tobacco. She reports current alcohol use. She reports that she does not use drugs.  Allergies  Allergen Reactions   Sulfa Antibiotics Other (See Comments)    Childhood allergy per patient    Medications  No current facility-administered medications for this encounter.  Current Outpatient Medications:    acetaminophen (TYLENOL) 500 MG tablet, Take 500 mg by mouth every 6 (six) hours as needed., Disp: , Rfl:    clotrimazole  (LOTRIMIN ) 1 % cream, Apply 1 application topically every morning and evening until 1 week after rash clears., Disp: 28 g, Rfl: 0   diphenhydrAMINE (BENADRYL) 25 mg capsule, Take 25 mg by mouth every 6 (six) hours as needed., Disp: , Rfl:    fexofenadine (ALLEGRA) 180 MG tablet, Take 180 mg by mouth daily., Disp: , Rfl:    fluticasone  (FLONASE ) 50 MCG/ACT nasal spray, Place 2 sprays into both nostrils daily., Disp: 16 g, Rfl: 6   levonorgestrel  (MIRENA ) 20 MCG/24HR IUD, 1 each by  Intrauterine route once., Disp: , Rfl:    Pseudoephedrine HCl (SUDAFED PO), Take by mouth., Disp: , Rfl:    venlafaxine  XR (EFFEXOR -XR) 150 MG 24 hr capsule, Take 1 capsule (150 mg total) by mouth daily with breakfast., Disp: 90 capsule, Rfl: 1   vitamin B-12 (CYANOCOBALAMIN) 1000 MCG tablet, Take 1,000 mcg by mouth daily., Disp: , Rfl:    Vitamin D , Ergocalciferol , (DRISDOL ) 1.25 MG (50000 UNIT) CAPS capsule, Take 1 capsule (50,000 Units total) by mouth every 7 (seven) days., Disp: 12 capsule, Rfl: 0  Vitals   Vitals:   03/22/24 1042 03/22/24 1226  03/22/24 1544  BP: (!) 112/94 114/83 105/68  Pulse: 80 63 63  Resp: 16 18 16   Temp: 98.8 F (37.1 C)  98.6 F (37 C)  TempSrc: Oral  Oral  SpO2: 100% 100% 100%  Weight: 65 kg      Body mass index is 25.14 kg/m.   Physical Exam   Constitutional: Appears well-developed and well-nourished.  Psych: Dysthymic affect is appropriate to situation Eyes: No scleral injection.  HENT: No OP obstruction.  Head: Normocephalic.  Respiratory: Effort normal, non-labored breathing.  Skin: WDI.   Neurologic Examination   Mental Status: Awake and alert. Fully oriented. Thought content appropriate. Speech fluent without evidence of aphasia.  Able to follow all commands without difficulty. No dysarthria.  Cranial Nerves: II: Temporal visual fields intact with no extinction to DSS. Possible subtle RAPD on the left. III,IV, VI: No ptosis. No nystagmus. Horizontal double vision with binocular central gaze (overlapping) resolves with either eye covered. Double vision is worse with far-away objects compared to near vision. There is transient exotropia alternating with esotropia during repeated right to left eye movements, with right eye tending to lag the left. With upgaze, eyes become esotropic.  V: Temp sensation equal bilaterally VII: Smile symmetric VIII: Hearing intact to voice IX,X: No hypophonia or hoarseness XI: Symmetric XII: Midline tongue extension Motor: RUE: 5/5  LUE: 5/5 RLE: 5/5 LLE: 5/5 Sensory: FT decreased to left side of thorax/trunk. Decreased temp sensation to RUE. Intact FT and temp to BLE. Deep Tendon Reflexes: 3+ bilateral biceps and brachioradialis. 3+ bilateral patellae. 2+ right achilles, 4+ left achilles (no clonus, spread to right calf muscles).  Plantars: Right: downgoingLeft: downgoing Cerebellar: No ataxia with FNF bilaterally, but with fine action tremor on the right and left.  Gait: Deferred  Labs/Imaging/Neurodiagnostic studies   CBC:  Recent Labs  Lab  03/22/24 1046  WBC 6.5  HGB 14.3  HCT 43.8  MCV 90.7  PLT 311   Basic Metabolic Panel:  Lab Results  Component Value Date   NA 138 03/22/2024   K 3.4 (L) 03/22/2024   CO2 24 03/22/2024   GLUCOSE 101 (H) 03/22/2024   BUN 10 03/22/2024   CREATININE 0.84 03/22/2024   CALCIUM 9.4 03/22/2024   GFRNONAA >60 03/22/2024   GFRAA 124 11/13/2019   Lipid Panel:  Lab Results  Component Value Date   LDLCALC 163 (H) 08/12/2023   HgbA1c:  Lab Results  Component Value Date   HGBA1C 5.4 08/12/2023    ASSESSMENT  48 y.o. female with a PMHx of headache, GERD, anxiety and allergy who presents to the ED with dizziness and gait unsteadiness as well as difficulty concentrating on others' speech. She was unable to comprehend what people were saying to her yesterday and also stated that I just don't feel right. No pain reported. MRI in the ED revealed multifocal infratentorial and supratentorial WM lesions with  a distribution and morphologies most consistent with MS; no enhancing lesions seen.  - Exam reveals patchy sensory deficits, tremor and hyperreflexia, in addition to deficits of ocular motility.  - MRI brain w/wo contrast: Numerous supratentorial and infratentorial brain lesions highly suspicious for demyelinating disease/multiple sclerosis. No abnormal enhancement. - Impression: MRI findings and her history of recurrent neurological deficits corresponding to separate localizations along the neuraxis are consistent with lesions separated in time and space. Although MS is highest on the DDx, other potential etiologies such as MOGAD, neuromyelitis optica and CNS lupus will be further assessed with MRI C-spine and additional labs.   RECOMMENDATIONS  - Start IV Solumedrol 1000 mg every day x 5 days. Risks/benefits have been discussed with the patient and she agrees to proceed.  - Monitor CBG while on steroids - Daily BMP and CBC - Anti-NMO antibody and ANA panel (ordered) - Anti-MOG antibody  (ordered) - MRI C-spine w/wo contrast (ordered) - Vitamin D  level (ordered) ______________________________________________________________________    Bonney SHARK, Chanise Habeck, MD [facility]

## 2024-03-22 NOTE — ED Triage Notes (Signed)
 Patient reports dizziness with eye movement, legs weakness and inability to concentrate that started yesterday. Patient states she has not sleep well in 2 days. Patient also states she was unable to comprehend what people was saying to her yesterday. Patient states I just don't feel right.  Patient denies pain at this time. Patient has not taken anything for symptoms.

## 2024-03-22 NOTE — Telephone Encounter (Signed)
 Patient is currently at the Urgent Care per chart notes.  Copied from CRM [id number]. Topic: Clinical - Red Word Triage >> Mar 22, 2024  8:16 AM Avram MATSU wrote: Red Word that prompted transfer to Nurse Triage: dizziness when she move her eyes, leg weakness.

## 2024-03-22 NOTE — ED Triage Notes (Signed)
 C/O dizziness since yesterday. STates has ongoing dizziness with eye movement, which returned yesterday, but worse today.  AAOx3. Skin warm and dry. NAD

## 2024-03-22 NOTE — ED Notes (Signed)
 Bed alarm on, fall risk bracelet on, grip socks on, and call bell near pt

## 2024-03-22 NOTE — Telephone Encounter (Signed)
 Routing to providers in office to advise. Can patient be worked in today?

## 2024-03-22 NOTE — Telephone Encounter (Signed)
 Copied from CRM #[id number]. Topic: Clinical - Refused Triage >> Mar 22, 2024  8:26 AM Avram MATSU wrote: Patient/caller voiced complaints of patient wanted to be seen today but there is no availability. Pt has extreme dizziness when she move her eyes and leg weakness sometimes. Declined transfer to triage.   ----------------------------------------------------------------------- From previous Reason for Contact - Scheduling: Patient/patient representative is calling to schedule an appointment. Refer to attachments for appointment information.

## 2024-03-22 NOTE — ED Notes (Signed)
 This RN ambulated pt to bathroom. Pt very unsteady on feet and unable to keep balance without assistance. Pt back in bed, falls bundle in place, call light within reach.

## 2024-03-22 NOTE — H&P (Signed)
 History and Physical   Erica Fowler FMW:[id number] DOB: 11/19/1975 DOA: 03/22/2024  PCP: Erica Pao, NP  Patient coming from: Urgent care center via POV  I have personally briefly reviewed patient's old medical records in [facility] EMR.  Chief Concern: Dizziness  HPI: Ms. Erica Fowler is a 48 year old female with history of GERD, anxiety, who presents ED for chief concerns of dizziness that has been ongoing since yesterday.  Vitals in the ED showed t of 98.6, rr 16, hr 63, blood pressure 105/68, SpO2 100% on room air.  Serum sodium is 138, potassium 3.5, chloride 103, bicarb 26, BUN of 10, serum creatinine 0.84, eGFR greater than 60, nonfasting blood glucose 101, WBC 6.5, hemoglobin 14.3, platelet 311.  UA was positive for nitrates.  MRI brain w wo contrast: Was read as numerous supratentorial and infratentorial brain lesions highly suspicious for demyelinating disease/multiple sclerosis.  No abnormal enhancement.  ED treatment: Meclizine  50 mg p.o. one-time dose, sodium chloride 1 L bolus.  EDP discussed with neurology who recommends for admission for high-dose IV steroids. --------------------------------- At bedside, patient able to tell me her first last name, age, location, current calendar year.  She reports that for the last 2 days, she has developed dizziness that is different from her normal dizziness.  She reports she has been having ongoing intermittent dizziness for several years and was diagnosed for vertigo.  However over the last 2 days, she reports this dizziness is different and that it is worse when she moves her eyes.  She denies double vision as she has baseline double vision that is corrected with her glasses.  Denies chest pain, abdominal pain, dysuria, hematuria, diarrhea.  She endorses urinary urgency that has been ongoing for 6 months.  She reports that she has been given medication by her PCP in the past however the medication just made her  unable to urinate appropriately.  Social history: She lives at home with her husband.  She denies tobacco, EtOH, recreational drug use.  She currently works in the Music therapist at [employer].  ROS: Constitutional: no weight change, no fever ENT/Mouth: no sore throat, no rhinorrhea Eyes: no eye pain, no vision changes Cardiovascular: no chest pain, no dyspnea,  no edema, no palpitations Respiratory: no cough, no sputum, no wheezing Gastrointestinal: no nausea, no vomiting, no diarrhea, no constipation Genitourinary: no urinary incontinence, no dysuria, no hematuria, + urinary urgency Musculoskeletal: no arthralgias, no myalgias Skin: no skin lesions, no pruritus, Neuro: no weakness, no loss of consciousness, no syncope, + dizziness Psych: no anxiety, no depression, no decrease appetite Heme/Lymph: no bruising, no bleeding  ED Course: Discussed with EDP, patient requiring hospitalization for chief concerns of multiple sclerosis requiring IV steroids.  Assessment/Plan  Principal Problem:   Multiple sclerosis Active Problems:   Anxiety   Urinary urgency   Hypokalemia   Assessment and Plan:  * Multiple sclerosis Neurology has been consulted by EDP Appreciate recommendation of treatment  Hypokalemia Mild Potassium chloride 20 mEq  p.o. one-time dose ordered  Urinary urgency She reports that this has been ongoing for several years and it has been worsening over the last 6 months.  She reports that she is to the point where if she has the urge to urinate, she needs to urinate right away otherwise she will have incontinence.  She reports that in [place], where getting a restroom was very difficult, she ended up urinating on herself because she was not able to find a restroom fast enough. Continue  outpatient follow-up with OB/GYN/PCP for long-term management  Anxiety Home venlafaxine  150 mg daily with breakfast resumed Lorazepam 0.5 mg p.o. twice daily every 6 hours as needed for  anxiety, 2 doses ordered  Chart reviewed.   DVT prophylaxis: Enoxaparin Code Status: Full code Diet: regular  Family Communication: A phone call was offered, patient declined Disposition Plan: Pending clinical course Consults called: Neurology Admission status: MedSurg, inpatient  Past Medical History:  Diagnosis Date   Allergy    Anxiety    GERD (gastroesophageal reflux disease)    Headache    Past Surgical History:  Procedure Laterality Date   CESAREAN SECTION  1997   LAPAROTOMY Left 06/07/2008   Social History:  reports that she has quit smoking. Her smoking use included cigarettes. She has a 10 pack-year smoking history. She has never used smokeless tobacco. She reports current alcohol use. She reports that she does not use drugs.  Allergies  Allergen Reactions   Sulfa Antibiotics Other (See Comments)    Childhood allergy per patient   Family History  Problem Relation Age of Onset   COPD Mother    Heart disease Father    Cancer Father        Prostate   Autoimmune disease Sister    Breast cancer Paternal Aunt    COPD Maternal Grandmother    Diabetes Maternal Grandmother    Renal Disease Maternal Grandmother    Diabetes Maternal Grandfather    Heart disease Maternal Grandfather    Hypertension Paternal Grandmother    Stroke Paternal Grandmother    Breast cancer Paternal Grandmother    Heart disease Paternal Grandfather    Lung cancer Paternal Grandfather    Family history: Family history reviewed and not pertinent.  Prior to Admission medications   Medication Sig Start Date End Date Taking? Authorizing Provider  acetaminophen (TYLENOL) 500 MG tablet Take 500 mg by mouth every 6 (six) hours as needed.    [provider]  clotrimazole  (LOTRIMIN ) 1 % cream Apply 1 application topically every morning and evening until 1 week after rash clears. 03/07/23   Vicky Charleston, PA-C  diphenhydrAMINE (BENADRYL) 25 mg capsule Take 25 mg by mouth every 6 (six)  hours as needed.    [provider]  fexofenadine (ALLEGRA) 180 MG tablet Take 180 mg by mouth daily.    [provider]  fluticasone  (FLONASE ) 50 MCG/ACT nasal spray Place 2 sprays into both nostrils daily. 08/12/23   Erica Pao, NP  levonorgestrel  (MIRENA ) 20 MCG/24HR IUD 1 each by Intrauterine route once.    [provider]  Pseudoephedrine HCl (SUDAFED PO) Take by mouth.    [provider]  venlafaxine  XR (EFFEXOR -XR) 150 MG 24 hr capsule Take 1 capsule (150 mg total) by mouth daily with breakfast. 08/12/23   Erica Pao, NP  vitamin B-12 (CYANOCOBALAMIN) 1000 MCG tablet Take 1,000 mcg by mouth daily.    [provider]  Vitamin D , Ergocalciferol , (DRISDOL ) 1.25 MG (50000 UNIT) CAPS capsule Take 1 capsule (50,000 Units total) by mouth every 7 (seven) days. 08/15/23   Erica Pao, NP   Physical Exam: Vitals:   03/22/24 1042 03/22/24 1226 03/22/24 1544  BP: (!) 112/94 114/83 105/68  Pulse: 80 63 63  Resp: 16 18 16   Temp: 98.8 F (37.1 C)  98.6 F (37 C)  TempSrc: Oral  Oral  SpO2: 100% 100% 100%  Weight: 65 kg     Constitutional: appears appropriate, mildly anxious Eyes: PERRL, lids and conjunctivae normal  ENMT: Mucous membranes are moist. Posterior pharynx clear of any exudate or lesions. Age-appropriate dentition. Hearing appropriate Neck: normal, supple, no masses, no thyromegaly Respiratory: clear to auscultation bilaterally, no wheezing, no crackles. Normal respiratory effort. No accessory muscle use.  Cardiovascular: Regular rate and rhythm, no murmurs / rubs / gallops. No extremity edema. 2+ pedal pulses. No carotid bruits.  Abdomen: no tenderness, no masses palpated, no hepatosplenomegaly. Bowel sounds positive.  Musculoskeletal: no clubbing / cyanosis. No joint deformity upper and lower extremities. Good ROM, no contractures, no atrophy. Normal muscle tone.  Skin: no rashes, lesions, ulcers. No induration Neurologic:  Sensation intact. Strength 5/5 in all 4.  Psychiatric: Normal judgment and insight. Alert and oriented x 3.  Depressed mood.  Tearful.  EKG: independently reviewed, showing sinus rhythm with rate of 68, QTc 427  Chest x-ray on Admission: I personally reviewed and I agree with radiologist reading as below.  MR Brain W and Wo Contrast Result Date: 03/22/2024 CLINICAL DATA:  Neuro deficit, acute, stroke suspected. Dizziness with eye movement. Leg weakness. Difficulty concentrating. EXAM: MRI HEAD WITHOUT AND WITH CONTRAST TECHNIQUE: Multiplanar, multiecho pulse sequences of the brain and surrounding structures were obtained without and with intravenous contrast. CONTRAST:  6mL GADAVIST GADOBUTROL 1 MMOL/ML IV SOLN COMPARISON:  None Available. FINDINGS: Brain: No definite acute infarct, intracranial hemorrhage, mass, midline shift, or extra-axial fluid collection is identified. Cerebral volume is normal. The ventricles are normal in size. There are greater than 20 small foci of T2 FLAIR hyperintensity involving the juxtacortical, deep, and periventricular white matter of both cerebral hemispheres. Multiple lesions are oriented perpendicularly to the lateral ventricles, and there is mild involvement of the corpus callosum. Lesions are also present in the brainstem and cerebellum including in the middle cerebellar peduncles and right medulla. Some lesions demonstrate T2 shine through on diffusion-weighted imaging, however no convincing restricted diffusion or abnormal enhancement is identified. Vascular: Major intracranial vascular flow voids are preserved. Skull and upper cervical spine: Unremarkable bone marrow signal. Sinuses/Orbits: Unremarkable orbits. Moderate mucosal thickening in the ethmoid sinuses and mild mucosal thickening elsewhere. Clear mastoid air cells. Other: None. IMPRESSION: Numerous supratentorial and infratentorial brain lesions highly suspicious for demyelinating disease/multiple sclerosis.  No abnormal enhancement. Electronically Signed   By: Dasie Hamburg M.D.   On: 03/22/2024 16:19   Labs on Admission: I have personally reviewed following labs  CBC: Recent Labs  Lab 03/22/24 1046  WBC 6.5  HGB 14.3  HCT 43.8  MCV 90.7  PLT 311   Basic Metabolic Panel: Recent Labs  Lab 03/22/24 1046  NA 138  K 3.4*  CL 103  CO2 24  GLUCOSE 101*  BUN 10  CREATININE 0.84  CALCIUM 9.4   GFR: CrCl cannot be calculated (Unknown ideal weight.).  Liver Function Tests: Recent Labs  Lab 03/22/24 1046  AST 35  ALT 21  ALKPHOS 43  BILITOT 0.8  PROT 8.0  ALBUMIN 4.7   CBG: Recent Labs  Lab 03/22/24 1543  GLUCAP 91   Urine analysis:    Component Value Date/Time   COLORURINE YELLOW (A) 03/22/2024 1124   APPEARANCEUR HAZY (A) 03/22/2024 1124   APPEARANCEUR Clear 08/12/2023 1054   LABSPEC 1.014 03/22/2024 1124   LABSPEC 1.027 05/11/2013 1522   PHURINE 5.0 03/22/2024 1124   GLUCOSEU NEGATIVE 03/22/2024 1124   GLUCOSEU Negative 05/11/2013 1522   HGBUR NEGATIVE 03/22/2024 1124   BILIRUBINUR NEGATIVE 03/22/2024 1124   BILIRUBINUR Negative 08/12/2023 1054   BILIRUBINUR Negative 05/11/2013 1522  KETONESUR 5 (A) 03/22/2024 1124   PROTEINUR NEGATIVE 03/22/2024 1124   UROBILINOGEN 0.2 02/02/2017 1546   NITRITE POSITIVE (A) 03/22/2024 1124   LEUKOCYTESUR NEGATIVE 03/22/2024 1124   LEUKOCYTESUR Negative 05/11/2013 1522   This document was prepared using Dragon Voice Recognition software and may include unintentional dictation errors.  Dr. Sherre [facility]  If 7PM-7AM, please contact overnight-coverage provider If 7AM-7PM, please contact day attending provider [email or web address]  03/22/2024, 6:42 PM

## 2024-03-22 NOTE — Assessment & Plan Note (Signed)
 She reports that this has been ongoing for several years and it has been worsening over the last 6 months.  She reports that she is to the point where if she has the urge to urinate, she needs to urinate right away otherwise she will have incontinence.  She reports that in [place], where getting a restroom was very difficult, she ended up urinating on herself because she was not able to find a restroom fast enough. Continue outpatient follow-up with OB/GYN/PCP for long-term management

## 2024-03-22 NOTE — Hospital Course (Addendum)
 Ms. Erica Fowler is a 48 year old female with history of GERD, anxiety, who presents ED for chief concerns of dizziness.  MRI of the brain showed numerous supratentorial and infratentorial brain lesions highly suspicious for demyelinating disease/multiple sclerosis.    Patient is seen by neurology, started on high-dose steroids for flare of multiple sclerosis for 5 days.

## 2024-03-22 NOTE — ED Notes (Signed)
 Patient is being discharged from the Urgent Care and sent to the Emergency Department via POV . Per A, White, NP, patient is in need of higher level of care due to dizziness and leg weakness, inability comprehend . Patient is aware and verbalizes understanding of plan of care.  Vitals:   03/22/24 0911  BP: 133/81  Pulse: 98  Resp: 18  Temp: 98 F (36.7 C)  SpO2: 97%

## 2024-03-22 NOTE — Assessment & Plan Note (Addendum)
 Neurology has been consulted by EDP Appreciate recommendation of treatment

## 2024-03-22 NOTE — ED Provider Notes (Signed)
 Patient presents to the urgent care with dizziness with all eyes movement, bilateral leg weakness beginning 1 day ago and endorses yesterday she was having difficulty focusing and comprehending speech but had no difficulty speaking.  Dizziness is described as feeling off balance.  History of vertigo but endorses this feels different.  Vital signs are stable but due to symptomology patient is being sent to the nearest emergency department for further evaluation.  Was going to escort self however on attempting to stand patient almost falling and legs were shaking therefore she is no longer able to drive at this point, declining EMS transport and has called family member to escort her   Teresa Shelba SAUN[place], [place] 03/22/24 [phone]

## 2024-03-22 NOTE — ED Provider Notes (Signed)
 Erica Fowler Provider Note    Event Date/Time   First MD Initiated Contact with Patient 03/22/24 1128     (approximate)   History   Dizziness   HPI  Erica Fowler is a 48 y.o. female with history of GERD, anxiety, presenting with dizziness.  States she does have history of vertigo but this is different.  States that is worse with head or eye movements.  Denies any tinnitus or ear symptoms, no headache.  States is gone to the point that she is unable to ambulate due to the dizziness because she will fall over and is unsteady.  Denies any recent trauma or falls, no infectious symptoms, no congestion.  Intermittently she will feel like her legs are weak.  Denies any back pain, no frequent alcohol use, denies drug use.  Denies any numbness.  States that she does have family history of MS but no personal history of MS, denies history of migraines.    On independent review, she was seen by primary care in March for anxiety, his chronic and controlled with Effexor .  She did go to urgent care today and was sent directly to the ED given her dizziness and difficulty walking.  Also complaining about leg weakness.  Physical Exam   Triage Vital Signs: ED Triage Vitals [03/22/24 1042]  Encounter Vitals Group     BP (!) 112/94     Girls Systolic BP Percentile      Girls Diastolic BP Percentile      Boys Systolic BP Percentile      Boys Diastolic BP Percentile      Pulse Rate 80     Resp 16     Temp 98.8 F (37.1 C)     Temp Source Oral     SpO2 100 %     Weight 143 lb 4.8 oz (65 kg)     Height      Head Circumference      Peak Flow      Pain Score 0     Pain Loc      Pain Education      Exclude from Growth Chart     Most recent vital signs: Vitals:   03/22/24 1226 03/22/24 1544  BP: 114/83 105/68  Pulse: 63 63  Resp: 18 16  Temp:  98.6 F (37 C)  SpO2: 100% 100%     General: Awake, no distress. CV:  Good peripheral perfusion.  Resp:  Normal  effort.  Abd:  No distention.  Other:  Pupils are equal reactive, extraocular movements are intact, no cranial nerve deficits, no focal weakness or numbness, no dysmetria, she is unsteady with ambulation.   ED Results / Procedures / Treatments   Labs (all labs ordered are listed, but only abnormal results are displayed) Labs Reviewed  COMPREHENSIVE METABOLIC PANEL WITH GFR - Abnormal; Notable for the following components:      Result Value   Potassium 3.4 (*)    Glucose, Bld 101 (*)    All other components within normal limits  URINALYSIS, ROUTINE W REFLEX MICROSCOPIC - Abnormal; Notable for the following components:   Color, Urine YELLOW (*)    APPearance HAZY (*)    Ketones, ur 5 (*)    Nitrite POSITIVE (*)    Bacteria, UA RARE (*)    All other components within normal limits  CBC  CBG MONITORING, ED  POC URINE PREG, ED     EKG  EKG shows,  EKG shows sinus rhythm, rate of 60, normal QS, normal QTc, no obvious ischemic ST elevation, T wave flattening in aVL, no prior to compare.   RADIOLOGY On my independent interpretation, MRI brain shows multiple lesions.   PROCEDURES:  Critical Care performed: No  Procedures   MEDICATIONS ORDERED IN ED: Medications  meclizine  (ANTIVERT ) tablet 50 mg (50 mg Oral Given 03/22/24 1219)  gadobutrol (GADAVIST) 1 MMOL/ML injection 6 mL (6 mLs Intravenous Contrast Given 03/22/24 1348)  sodium chloride 0.9 % bolus 1,000 mL (1,000 mLs Intravenous New Bag/Given 03/22/24 1616)     IMPRESSION / MDM / ASSESSMENT AND PLAN / ED COURSE  I reviewed the triage vital signs and the nursing notes.                              Differential diagnosis includes, but is not limited to, MS, CVA, mass, peripheral vertigo, electrolyte derangements.  Will get labs, MRI brain with and without, meclizine .  Reassess.  Patient's presentation is most consistent with acute presentation with potential threat to life or bodily function.  Independent  interpretation of labs and imaging below.  Discussed with patient about imaging and lab results.  She is still unsteady especially when she gets up.  Consulted neurology who recommended admission, given her symptoms and the findings on MRI, she will need to be admitted for high-dose steroids.  Will reach out to hospitalist for admission.  Consult the hospitalist will admit the patient.  She is admitted.  The patient is on the cardiac monitor to evaluate for evidence of arrhythmia and/or significant heart rate changes.   Clinical Course as of 03/22/24 1654  Thu Mar 22, 2024  1153 Independent review of labs, electrolytes severely deranged, LFTs are normal, no leukocytosis, pregnancy test is negative. [TT]  1211 UA is positive for nitrites, rare bacteria, dirty sample, is not having any urinary symptoms at this time.  Doubt UTI. [TT]  1628 MR Brain W and Wo Contrast IMPRESSION: Numerous supratentorial and infratentorial brain lesions highly suspicious for demyelinating disease/multiple sclerosis. No abnormal enhancement.   [TT]    Clinical Course User Index [TT] [place], Lorelle Cummins, MD     FINAL CLINICAL IMPRESSION(S) / ED DIAGNOSES   Final diagnoses:  Dizziness  MS (multiple sclerosis)     Rx / DC Orders   ED Discharge Orders     None        Note:  This document was prepared using Dragon voice recognition software and may include unintentional dictation errors.    [place] Lorelle Cummins, MD 03/22/24 [phone]

## 2024-03-22 NOTE — Assessment & Plan Note (Signed)
 Mild Potassium chloride 20 mEq  p.o. one-time dose ordered

## 2024-03-22 NOTE — Assessment & Plan Note (Signed)
 Home venlafaxine  150 mg daily with breakfast resumed Lorazepam 0.5 mg p.o. twice daily every 6 hours as needed for anxiety, 2 doses ordered

## 2024-03-23 ENCOUNTER — Inpatient Hospital Stay

## 2024-03-23 DIAGNOSIS — G35D Multiple sclerosis, unspecified: Secondary | ICD-10-CM | POA: Diagnosis not present

## 2024-03-23 DIAGNOSIS — M47812 Spondylosis without myelopathy or radiculopathy, cervical region: Secondary | ICD-10-CM | POA: Diagnosis not present

## 2024-03-23 DIAGNOSIS — M50221 Other cervical disc displacement at C4-C5 level: Secondary | ICD-10-CM | POA: Diagnosis not present

## 2024-03-23 DIAGNOSIS — M4802 Spinal stenosis, cervical region: Secondary | ICD-10-CM | POA: Diagnosis not present

## 2024-03-23 DIAGNOSIS — E876 Hypokalemia: Secondary | ICD-10-CM

## 2024-03-23 DIAGNOSIS — M5021 Other cervical disc displacement,  high cervical region: Secondary | ICD-10-CM | POA: Diagnosis not present

## 2024-03-23 LAB — BASIC METABOLIC PANEL WITH GFR
Anion gap: 9 (ref 5–15)
BUN: 14 mg/dL (ref 6–20)
CO2: 23 mmol/L (ref 22–32)
Calcium: 8.9 mg/dL (ref 8.9–10.3)
Chloride: 108 mmol/L (ref 98–111)
Creatinine, Ser: 0.78 mg/dL (ref 0.44–1.00)
GFR, Estimated: >60 mL/min (ref >60)
Glucose, Bld: 130 mg/dL — ABNORMAL HIGH (ref 70–99)
Potassium: 4 mmol/L (ref 3.5–5.1)
Sodium: 140 mmol/L (ref 135–145)

## 2024-03-23 LAB — CBC
HCT: 43.2 % (ref 36.0–46.0)
Hemoglobin: 14.1 g/dL (ref 12.0–15.0)
MCH: 29.5 pg (ref 26.0–34.0)
MCHC: 32.6 g/dL (ref 30.0–36.0)
MCV: 90.4 fL (ref 80.0–100.0)
Platelets: 281 K/uL (ref 150–400)
RBC: 4.78 MIL/uL (ref 3.87–5.11)
RDW: 12.3 % (ref 11.5–15.5)
WBC: 6.7 K/uL (ref 4.0–10.5)
nRBC: 0 % (ref 0.0–0.2)

## 2024-03-23 LAB — GLUCOSE, CAPILLARY: Glucose-Capillary: 118 mg/dL — ABNORMAL HIGH (ref 70–99)

## 2024-03-23 LAB — VITAMIN D 25 HYDROXY (VIT D DEFICIENCY, FRACTURES): Vit D, 25-Hydroxy: 29.81 ng/mL — ABNORMAL LOW (ref 30–100)

## 2024-03-23 MED ORDER — SODIUM CHLORIDE 0.9 % IV SOLN
1000.0000 mg | Freq: Every day | INTRAVENOUS | Status: DC
  Administered 2024-03-23: 1000 mg via INTRAVENOUS
  Filled 2024-03-23: qty 16

## 2024-03-23 MED ORDER — VITAMIN D (ERGOCALCIFEROL) 1.25 MG (50000 UNIT) PO CAPS
50000.0000 [IU] | ORAL_CAPSULE | Freq: Once | ORAL | Status: AC
  Administered 2024-03-23: 50000 [IU] via ORAL
  Filled 2024-03-23: qty 1

## 2024-03-23 MED ORDER — LORATADINE 10 MG PO TABS
10.0000 mg | ORAL_TABLET | Freq: Every day | ORAL | Status: DC
  Administered 2024-03-23 – 2024-03-26 (×4): 10 mg via ORAL
  Filled 2024-03-23 (×4): qty 1

## 2024-03-23 MED ORDER — SODIUM CHLORIDE 0.9 % IV SOLN: 1000.0000 mg | Freq: Every day | INTRAVENOUS | Status: DC

## 2024-03-23 MED ORDER — VITAMIN D 25 MCG (1000 UNIT) PO TABS
1000.0000 [IU] | ORAL_TABLET | Freq: Every day | ORAL | Status: DC
  Administered 2024-03-24 – 2024-03-26 (×3): 1000 [IU] via ORAL
  Filled 2024-03-23 (×3): qty 1

## 2024-03-23 MED ORDER — VITAMIN D 25 MCG (1000 UNIT) PO TABS: 1000.0000 [IU] | ORAL_TABLET | Freq: Every day | ORAL | Status: DC

## 2024-03-23 MED ORDER — GADOBUTROL 1 MMOL/ML IV SOLN
6.0000 mL | Freq: Once | INTRAVENOUS | Status: AC | PRN
  Administered 2024-03-23: 6 mL via INTRAVENOUS

## 2024-03-23 MED ORDER — VITAMIN D (ERGOCALCIFEROL) 1.25 MG (50000 UNIT) PO CAPS: 50000.0000 [IU] | ORAL_CAPSULE | Freq: Once | ORAL | Status: DC

## 2024-03-23 NOTE — Plan of Care (Signed)
  Problem: Education: Goal: Knowledge of General Education information will improve Description: Including pain rating scale, medication(s)/side effects and non-pharmacologic comfort measures 03/23/2024 0150 by Danny Lint, RN Outcome: Progressing 03/23/2024 0149 by Danny Lint, RN Outcome: Progressing   Problem: Health Behavior/Discharge Planning: Goal: Ability to manage health-related needs will improve 03/23/2024 0150 by Danny Lint, RN Outcome: Progressing 03/23/2024 0149 by Danny Lint, RN Outcome: Progressing   Problem: Clinical Measurements: Goal: Ability to maintain clinical measurements within normal limits will improve 03/23/2024 0150 by Danny Lint, RN Outcome: Progressing 03/23/2024 0149 by Danny Lint, RN Outcome: Progressing Goal: Will remain free from infection 03/23/2024 0150 by Danny Lint, RN Outcome: Progressing 03/23/2024 0149 by Danny Lint, RN Outcome: Progressing Goal: Diagnostic test results will improve 03/23/2024 0150 by Danny Lint, RN Outcome: Progressing 03/23/2024 0149 by Danny Lint, RN Outcome: Progressing Goal: Respiratory complications will improve 03/23/2024 0150 by Danny Lint, RN Outcome: Progressing 03/23/2024 0149 by Danny Lint, RN Outcome: Progressing Goal: Cardiovascular complication will be avoided 03/23/2024 0150 by Danny Lint, RN Outcome: Progressing 03/23/2024 0149 by Danny Lint, RN Outcome: Progressing   Problem: Activity: Goal: Risk for activity intolerance will decrease 03/23/2024 0150 by Danny Lint, RN Outcome: Progressing 03/23/2024 0149 by Danny Lint, RN Outcome: Progressing   Problem: Nutrition: Goal: Adequate nutrition will be maintained 03/23/2024 0150 by Danny Lint, RN Outcome: Progressing 03/23/2024 0149 by Danny Lint, RN Outcome: Progressing   Problem: Coping: Goal: Level of anxiety will decrease 03/23/2024 0150 by Danny Lint, RN Outcome:  Progressing 03/23/2024 0149 by Danny Lint, RN Outcome: Progressing   Problem: Elimination: Goal: Will not experience complications related to bowel motility 03/23/2024 0150 by Danny Lint, RN Outcome: Progressing 03/23/2024 0149 by Danny Lint, RN Outcome: Progressing Goal: Will not experience complications related to urinary retention 03/23/2024 0150 by Danny Lint, RN Outcome: Progressing 03/23/2024 0149 by Danny Lint, RN Outcome: Progressing   Problem: Pain Managment: Goal: General experience of comfort will improve and/or be controlled 03/23/2024 0150 by Danny Lint, RN Outcome: Progressing 03/23/2024 0149 by Danny Lint, RN Outcome: Progressing   Problem: Safety: Goal: Ability to remain free from injury will improve 03/23/2024 0150 by Danny Lint, RN Outcome: Progressing 03/23/2024 0149 by Danny Lint, RN Outcome: Progressing   Problem: Skin Integrity: Goal: Risk for impaired skin integrity will decrease 03/23/2024 0150 by Danny Lint, RN Outcome: Progressing 03/23/2024 0149 by Danny Lint, RN Outcome: Progressing

## 2024-03-23 NOTE — TOC CM/SW Note (Signed)
 Transition of Care [facility]) - Inpatient Brief Assessment   Patient Details  Name: Erica Fowler MRN: [id number] Date of Birth: 1975/09/15  Transition of Care [facility]) CM/SW Contact:    Corean ONEIDA Haddock, RN Phone Number: 03/23/2024, 9:12 AM   Clinical Narrative:   Transition of Care [facility]) Screening Note   Patient Details  Name: Erica Fowler Date of Birth: February 04, 1976   Transition of Care [facility]) CM/SW Contact:    Corean ONEIDA Haddock, RN Phone Number: 03/23/2024, 9:12 AM    Transition of Care Department [facility]) has reviewed patient and no TOC needs have been identified at this time.  If new patient transition needs arise, please place a TOC consult.    Transition of Care Asessment: Insurance and Status: Insurance coverage has been reviewed Patient has primary care physician: Yes     Prior/Current Home Services: No current home services Social Drivers of Health Review: SDOH reviewed no interventions necessary Readmission risk has been reviewed: Yes Transition of care needs: no transition of care needs at this time

## 2024-03-23 NOTE — Progress Notes (Addendum)
 NEUROLOGY CONSULT FOLLOW UP NOTE   Date of service: March 23, 2024 Patient Name: Erica Fowler MRN:  [id number] DOB:  1975-10-22  Interval Hx/subjective  Mild improvement in ambulation today. She has completed her first dose of IV Solumedrol. Husband is at the bedside.   Vitals   Vitals:   03/22/24 2130 03/23/24 0033 03/23/24 0319 03/23/24 0748  BP: 99/66  107/66 115/67  Pulse: 68  (!) 58 83  Resp:   16 16  Temp: 98.2 F (36.8 C)  98.2 F (36.8 C) 98 F (36.7 C)  TempSrc: Oral  Oral Oral  SpO2: 98%  100% 100%  Weight:      Height:  5' 3.31 (1.608 m)       Body mass index is 25.14 kg/m.  Physical Exam   Constitutional: Appears well-developed and well-nourished.  Psych: Brighter affect today Eyes: No scleral injection.  HENT: No OP obstruction.  Head: Normocephalic.  Respiratory: Effort normal, non-labored breathing.  Skin: WDI.   Neurologic Examination   Mental Status: Awake and alert. Fully oriented. Thought content appropriate. Speech fluent without evidence of aphasia.  Able to follow all commands without difficulty. No dysarthria.  Cranial Nerves: II: Temporal visual fields intact with no extinction to DSS.  III,IV, VI: No ptosis. No nystagmus. Horizontal double vision with binocular central gaze (overlapping) resolves with either eye covered. Double vision is worse with far-away objects compared to near vision. There is transient exotropia alternating with esotropia during repeated right to left eye movements, with right eye tending to lag the left. With upgaze, eyes become esotropic. Findings unchanged since yesterday.  VII: Smile symmetric VIII: Hearing intact to voice IX,X: No hypophonia or hoarseness XI: Symmetric XII: No lingual dysarthria Motor: RUE: 5/5  LUE: 5/5 RLE: 5/5 LLE: 4+/5 Sensory: FT decreased to left side of thorax/trunk. Intact FT to BUE. Decreased FT to LLE. Deep Tendon Reflexes: 3+ bilateral biceps and brachioradialis. 3+  bilateral patellae. 2+ right achilles, 2+ left achilles   Cerebellar: No ataxia with FNF bilaterally  Gait: Deferred  Medications  Current Facility-Administered Medications:    acetaminophen (TYLENOL) tablet 650 mg, 650 mg, Oral, Q6H PRN **OR** acetaminophen (TYLENOL) suppository 650 mg, 650 mg, Rectal, Q6H PRN, Cox, Amy N, DO   cyanocobalamin (VITAMIN B12) tablet 1,000 mcg, 1,000 mcg, Oral, Daily, Cox, Amy N, DO, 1,000 mcg at 03/23/24 0803   enoxaparin (LOVENOX) injection 40 mg, 40 mg, Subcutaneous, Q24H, Cox, Amy N, DO, 40 mg at 03/22/24 2306   fluticasone  (FLONASE ) 50 MCG/ACT nasal spray 2 spray, 2 spray, Each Nare, Daily PRN, Cox, Amy N, DO   hydrALAZINE (APRESOLINE) injection 5 mg, 5 mg, Intravenous, Q6H PRN, Cox, Amy N, DO   loratadine (CLARITIN) tablet 10 mg, 10 mg, Oral, Daily, Zhang, Dekui, MD, 10 mg at 03/23/24 1122   LORazepam (ATIVAN) tablet 0.5 mg, 0.5 mg, Oral, Q6H PRN, Cox, Amy N, DO   melatonin tablet 5 mg, 5 mg, Oral, QHS PRN, Cox, Amy N, DO   methylPREDNISolone sodium succinate (SOLU-MEDROL) 1,000 mg in sodium chloride 0.9 % 50 mL IVPB, 1,000 mg, Intravenous, Daily, Wealthy Danielski, MD, Stopped at 03/23/24 0141   ondansetron (ZOFRAN) tablet 4 mg, 4 mg, Oral, Q6H PRN **OR** ondansetron (ZOFRAN) injection 4 mg, 4 mg, Intravenous, Q6H PRN, Cox, Amy N, DO   venlafaxine  XR (EFFEXOR -XR) 24 hr capsule 150 mg, 150 mg, Oral, Q breakfast, Cox, Amy N, DO, 150 mg at 03/23/24 0802  Labs and Diagnostic Imaging   CBC:  Recent Labs  Lab 03/22/24 1046 03/23/24 0456  WBC 6.5 6.7  HGB 14.3 14.1  HCT 43.8 43.2  MCV 90.7 90.4  PLT 311 281    Basic Metabolic Panel:  Lab Results  Component Value Date   NA 140 03/23/2024   K 4.0 03/23/2024   CO2 23 03/23/2024   GLUCOSE 130 (H) 03/23/2024   BUN 14 03/23/2024   CREATININE 0.78 03/23/2024   CALCIUM 8.9 03/23/2024   GFRNONAA >60 03/23/2024   GFRAA 124 11/13/2019   Lipid Panel:  Lab Results  Component Value Date   LDLCALC 163 (H)  08/12/2023   HgbA1c:  Lab Results  Component Value Date   HGBA1C 5.4 08/12/2023     Assessment  48 y.o. female with a PMHx of headache, GERD, anxiety and allergy who presented to the ED on Thursday with dizziness and gait unsteadiness as well as difficulty concentrating on others' speech. She was unable to comprehend what people were saying to her on Wednesday and also stated that I just don't feel right. No pain reported. MRI brain in the ED revealed multifocal infratentorial and supratentorial WM lesions with a distribution and morphologies most consistent with MS; no enhancing lesions seen.  - Exam reveals patchy sensory deficits, mild LLE weakness and hyperreflexia, in addition to deficits of ocular motility.  - Gait mildly improved today after 1st dose of steroids.  - MRI brain w/wo contrast: Numerous supratentorial and infratentorial brain lesions highly suspicious for demyelinating disease/multiple sclerosis. No abnormal enhancement. - MRI C-spine w/wo contrast: Short segment T2 hyperintense lesion in the left ventrolateral cervical cord at C2. Possible additional signal abnormality in the left dorsolateral cord at C5. Findings concerning for demyelinating process. No enhancement to suggest active demyelination. Focal lesion within the medulla without abnormal enhancement, likely related to demyelinating process. Degenerative changes with mild spinal canal stenoses at 3 levels and moderate right neural foraminal stenosis at C5-C6. - Labs: - Vitamin D  level is low at 29.81. Per the literature, the risk of relapse in MS is inversely correlated with serum vitamin D  level. The risk of developing MS is also increased in individuals with lower vitamin D  levels.  - Anti-NMO antibody and ANA panel pending - Anti-MOG antibody pending - Impression: MRI findings and her history of recurrent neurological deficits corresponding to separate localizations along the neuraxis are consistent with lesions  separated in time and space. Although MS is highest on the DDx, other potential etiologies such as MOGAD and CNS lupus will be further assessed with additional labs. NMO is now felt to be significantly lower on the DDx given no longitudinally extensive demyelination lesion seen on MRI C-spine.    Recommendations  - Continue IV Solumedrol 1000 mg every day x 5 days. Tonight will be dose 2/5.  - Monitor CBG while on steroids - Daily BMP and CBC - Administering 50,000 international units of vitamin D2 PO x 1 today (ordered) - Starting vitamin D3 oral supplementation, 1000 international units daily (ordered) - PT/OT - Will need outpatient Neurology follow up after discharge  ______________________________________________________________________   Bonney SHARK, Palmer Shorey, MD [facility]

## 2024-03-23 NOTE — Progress Notes (Signed)
  Progress Note   Patient: Erica Fowler FMW:[id number] DOB: 05-04-1976 DOA: 03/22/2024     1 DOS: the patient was seen and examined on 03/23/2024   Brief hospital course: Ms. Erica Fowler is a 48 year old female with history of GERD, anxiety, who presents ED for chief concerns of dizziness.  MRI of the brain showed numerous supratentorial and infratentorial brain lesions highly suspicious for demyelinating disease/multiple sclerosis.    Patient is seen by neurology, started on high-dose steroids for flare of multiple sclerosis for 5 days.   Principal Problem:   Multiple sclerosis Active Problems:   Anxiety   Urinary urgency   Hypokalemia   Assessment and Plan: * Multiple sclerosis, new diagnosis with flare Started Solu-Medrol 1 g daily since 10/16.  Will complete 5 days  Monitor glucose for hyperglycemia.  Hypokalemia Resolved.  Urinary urgency No urinary tract infection.  Follow-up with PCP as outpatient.  Anxiety Continue home medicines.       Subjective:  Patient still feels dizzy with turning of the head.  Physical Exam: Vitals:   03/22/24 2130 03/23/24 0033 03/23/24 0319 03/23/24 0748  BP: 99/66  107/66 115/67  Pulse: 68  (!) 58 83  Resp:   16 16  Temp: 98.2 F (36.8 C)  98.2 F (36.8 C) 98 F (36.7 C)  TempSrc: Oral  Oral Oral  SpO2: 98%  100% 100%  Weight:      Height:  5' 3.31 (1.608 m)     General exam: Appears calm and comfortable  Respiratory system: Clear to auscultation. Respiratory effort normal. Cardiovascular system: S1 & S2 heard, RRR. No JVD, murmurs, rubs, gallops or clicks. No pedal edema. Gastrointestinal system: Abdomen is nondistended, soft and nontender. No organomegaly or masses felt. Normal bowel sounds heard. Central nervous system: Alert and oriented. No focal neurological deficits. Extremities: Symmetric 5 x 5 power. Skin: No rashes, lesions or ulcers Psychiatry: Judgement and insight appear normal. Mood & affect  appropriate.    Data Reviewed:  Reviewed MRI results and lab results.  Family Communication: None  Disposition: Status is: Inpatient Remains inpatient appropriate because: Severity of disease, IV treatment     Time spent: 35 minutes  Author: Murvin Mana, MD 03/23/2024 11:59 AM  For on call review [email or web address].

## 2024-03-24 DIAGNOSIS — G35D Multiple sclerosis, unspecified: Secondary | ICD-10-CM | POA: Diagnosis not present

## 2024-03-24 LAB — GLUCOSE, CAPILLARY: Glucose-Capillary: 148 mg/dL — ABNORMAL HIGH (ref 70–99)

## 2024-03-24 MED ORDER — SODIUM CHLORIDE 0.9 % IV SOLN
1000.0000 mg | Freq: Every day | INTRAVENOUS | Status: AC
  Administered 2024-03-24 – 2024-03-26 (×3): 1000 mg via INTRAVENOUS
  Filled 2024-03-24 (×3): qty 16

## 2024-03-24 NOTE — Progress Notes (Signed)
 Patient ambulated on corridor independently  Erica Fowler V [name]

## 2024-03-24 NOTE — Progress Notes (Signed)
 NEUROLOGY CONSULT FOLLOW UP NOTE   Date of service: March 24, 2024 Patient Name: Erica Fowler MRN:  [id number] DOB:  January 04, 1976  Interval Hx/subjective  Ambulation further improved today, per patient and husband. Dysequilibrium improved.   Vitals   Vitals:   03/23/24 0748 03/23/24 1954 03/24/24 0346 03/24/24 0725  BP: 115/67 105/63 115/69 119/73  Pulse: 83 86 70 89  Resp: 16 16 16 16   Temp: 98 F (36.7 C) 98.6 F (37 C) 98.4 F (36.9 C) 98.2 F (36.8 C)  TempSrc: Oral Oral Oral Oral  SpO2: 100% 98% 99% 99%  Weight:      Height:         Body mass index is 25.14 kg/m.  Physical Exam   Constitutional: Appears well-developed and well-nourished.  Psych: Bright affect today Eyes: No scleral injection.  HENT: No OP obstruction.  Head: Normocephalic.  Respiratory: Effort normal, non-labored breathing.  Skin: WDI.   Neurologic Examination   Mental Status: Awake and alert. Fully oriented. Thought content appropriate. Speech fluent without evidence of aphasia.  Able to follow all commands without difficulty. No dysarthria.  Cranial Nerves: II: Temporal visual fields intact with no extinction to DSS.  III,IV, VI: No ptosis. No nystagmus.  Double vision improved today. Right eye slightly lags the left when tracking from far right horizontal gaze to the left. With upgaze, eyes transiently esotropic and improved since yesterday.   V: Temp sensation is equal VII: Smile symmetric VIII: Hearing intact to voice IX,X: No hypophonia or hoarseness XI: Symmetric XII: No lingual dysarthria Motor: RUE: 5/5  LUE: 5/5 RLE: 5/5 LLE: 4+/5 Sensory: Decreased temp sensation to LUE. Deep Tendon Reflexes: 3+ right biceps and brachioradialis; 2+ left biceps and brachioradialis. 3+ bilateral patellae. 2+ bilateral achilles.   Cerebellar: No ataxia with FNF bilaterally  Gait: Deferred  Medications  Current Facility-Administered Medications:    acetaminophen (TYLENOL) tablet 650  mg, 650 mg, Oral, Q6H PRN, 650 mg at 03/23/24 2254 **OR** acetaminophen (TYLENOL) suppository 650 mg, 650 mg, Rectal, Q6H PRN, Cox, Amy N, DO   cholecalciferol (VITAMIN D3) 25 MCG (1000 UNIT) tablet 1,000 Units, 1,000 Units, Oral, Daily, Zehra Rucci, MD   cyanocobalamin (VITAMIN B12) tablet 1,000 mcg, 1,000 mcg, Oral, Daily, Cox, Amy N, DO, 1,000 mcg at 03/23/24 0803   enoxaparin (LOVENOX) injection 40 mg, 40 mg, Subcutaneous, Q24H, Cox, Amy N, DO, 40 mg at 03/23/24 2110   fluticasone  (FLONASE ) 50 MCG/ACT nasal spray 2 spray, 2 spray, Each Nare, Daily PRN, Cox, Amy N, DO   hydrALAZINE (APRESOLINE) injection 5 mg, 5 mg, Intravenous, Q6H PRN, Cox, Amy N, DO   loratadine (CLARITIN) tablet 10 mg, 10 mg, Oral, Daily, Zhang, Dekui, MD, 10 mg at 03/23/24 1122   LORazepam (ATIVAN) tablet 0.5 mg, 0.5 mg, Oral, Q6H PRN, Cox, Amy N, DO   melatonin tablet 5 mg, 5 mg, Oral, QHS PRN, Cox, Amy N, DO   methylPREDNISolone sodium succinate (SOLU-MEDROL) 1,000 mg in sodium chloride 0.9 % 50 mL IVPB, 1,000 mg, Intravenous, Daily, Rayaan Garguilo, MD, Last Rate: 66 mL/hr at 03/23/24 2250, Restarted at 03/23/24 2250   ondansetron (ZOFRAN) tablet 4 mg, 4 mg, Oral, Q6H PRN **OR** ondansetron (ZOFRAN) injection 4 mg, 4 mg, Intravenous, Q6H PRN, Cox, Amy N, DO   venlafaxine  XR (EFFEXOR -XR) 24 hr capsule 150 mg, 150 mg, Oral, Q breakfast, Cox, Amy N, DO, 150 mg at 03/23/24 0802  Labs and Diagnostic Imaging   CBC:  Recent Labs  Lab 03/22/24 1046  03/23/24 0456  WBC 6.5 6.7  HGB 14.3 14.1  HCT 43.8 43.2  MCV 90.7 90.4  PLT 311 281    Basic Metabolic Panel:  Lab Results  Component Value Date   NA 140 03/23/2024   K 4.0 03/23/2024   CO2 23 03/23/2024   GLUCOSE 130 (H) 03/23/2024   BUN 14 03/23/2024   CREATININE 0.78 03/23/2024   CALCIUM 8.9 03/23/2024   GFRNONAA >60 03/23/2024   GFRAA 124 11/13/2019   Lipid Panel:  Lab Results  Component Value Date   LDLCALC 163 (H) 08/12/2023   HgbA1c:  Lab Results   Component Value Date   HGBA1C 5.4 08/12/2023     Assessment  48 y.o. female with a PMHx of headache, GERD, anxiety and allergy who presented to the ED on Thursday with dizziness, gait unsteadiness and difficulty concentrating on others' speech. She was unable to comprehend what people were saying to her on Wednesday and also stated that I just don't feel right. No pain reported. MRI brain in the ED revealed multifocal infratentorial and supratentorial WM lesions with a distribution and morphologies most consistent with MS; no enhancing lesions seen.  - Exam today reveals continued improvement (see details documented in exam above).  - Symptomatically, her gait and dysequilibrium are improved.  - MRI brain w/wo contrast: Numerous supratentorial and infratentorial brain lesions highly suspicious for demyelinating disease/multiple sclerosis. No abnormal enhancement. - MRI C-spine w/wo contrast: Short segment T2 hyperintense lesion in the left ventrolateral cervical cord at C2. Possible additional signal abnormality in the left dorsolateral cord at C5. Findings concerning for demyelinating process. No enhancement to suggest active demyelination. Focal lesion within the medulla without abnormal enhancement, likely related to demyelinating process. Degenerative changes with mild spinal canal stenoses at 3 levels and moderate right neural foraminal stenosis at C5-C6. - Labs: - Vitamin D  level is low at 29.81. Per the literature, the risk of relapse in MS is inversely correlated with serum vitamin D  level. The risk of developing MS is also increased in individuals with lower vitamin D  levels.  - Anti-NMO antibody pending. - Prior ANA titer in 2019 was negative. ANA panel ordered during this admission is pending - Anti-MOG antibody pending - Impression: MRI findings and her history of recurrent neurological deficits corresponding to separate localizations along the neuraxis are consistent with lesions  separated in time and space. Although MS is highest on the DDx, other potential etiologies such as MOGAD and CNS lupus will be further assessed with additional labs. NMO is now felt to be significantly lower on the DDx given no longitudinally extensive demyelination lesion seen on MRI C-spine.   Recommendations  - Continue IV Solumedrol 1000 mg every day x 5 days. Today will be dose 3/5.  - Monitor CBG while on steroids. Also obtain daily BMP and CBC - 50,000 international units of vitamin D2 was administered on Friday - Starting vitamin D3 oral supplementation, 1000 international units daily. Continue indefinitely.  - PT/OT - Will need outpatient Neurology follow up after discharge ______________________________________________________________________   Bonney SHARK, Jakhi Dishman, MD [facility]

## 2024-03-24 NOTE — Plan of Care (Signed)

## 2024-03-24 NOTE — Progress Notes (Signed)
  Progress Note   Patient: Erica Fowler FMW:[id number] DOB: March 09, 1976 DOA: 03/22/2024     2 DOS: the patient was seen and examined on 03/24/2024   Brief hospital course: Ms. Erica Fowler is a 48 year old female with history of GERD, anxiety, who presents ED for chief concerns of dizziness.  MRI of the brain showed numerous supratentorial and infratentorial brain lesions highly suspicious for demyelinating disease/multiple sclerosis.    Patient is seen by neurology, started on high-dose steroids for flare of multiple sclerosis for 5 days.   Principal Problem:   Multiple sclerosis Active Problems:   Anxiety   Urinary urgency   Hypokalemia   Assessment and Plan: * Multiple sclerosis, new diagnosis with flare Started Solu-Medrol 1 g daily since 10/16.  Will complete 5 days, final dose on Monday.  Dizziness essentially resolved. Glucose not significant elevated, patient has some headaches from steroid.  Continue to monitor.   Hypokalemia Resolved.   Urinary urgency No urinary tract infection.  Follow-up with PCP as outpatient.   Anxiety Continue home medicines.      Subjective:  Patient complaining some headaches, no nausea vomiting.  Dizziness essentially resolved  Physical Exam: Vitals:   03/23/24 0748 03/23/24 1954 03/24/24 0346 03/24/24 0725  BP: 115/67 105/63 115/69 119/73  Pulse: 83 86 70 89  Resp: 16 16 16 16   Temp: 98 F (36.7 C) 98.6 F (37 C) 98.4 F (36.9 C) 98.2 F (36.8 C)  TempSrc: Oral Oral Oral Oral  SpO2: 100% 98% 99% 99%  Weight:      Height:       General exam: Appears calm and comfortable  Respiratory system: Clear to auscultation. Respiratory effort normal. Cardiovascular system: S1 & S2 heard, RRR. No JVD, murmurs, rubs, gallops or clicks. No pedal edema. Gastrointestinal system: Abdomen is nondistended, soft and nontender. No organomegaly or masses felt. Normal bowel sounds heard. Central nervous system: Alert and oriented. No  focal neurological deficits. Extremities: Symmetric 5 x 5 power. Skin: No rashes, lesions or ulcers Psychiatry: Judgement and insight appear normal. Mood & affect appropriate. '  Data Reviewed:  Results reviewed  Family Communication: None  Disposition: Status is: Inpatient Remains inpatient appropriate because: Severity of disease, IV treatment     Time spent: 35 minutes  Author: Murvin Mana, MD 03/24/2024 11:24 AM  For on call review [email or web address].

## 2024-03-25 DIAGNOSIS — R3915 Urgency of urination: Secondary | ICD-10-CM

## 2024-03-25 DIAGNOSIS — G35D Multiple sclerosis, unspecified: Secondary | ICD-10-CM | POA: Diagnosis not present

## 2024-03-25 LAB — GLUCOSE, CAPILLARY: Glucose-Capillary: 154 mg/dL — ABNORMAL HIGH (ref 70–99)

## 2024-03-25 LAB — ANA COMPREHENSIVE PANEL

## 2024-03-25 LAB — NEUROMYELITIS OPTICA AUTOAB, IGG: NMO-IgG: <1.5 U/mL (ref 0.0–3.0)

## 2024-03-25 NOTE — Progress Notes (Signed)
  Progress Note   Patient: Erica Fowler FMW:[id number] DOB: [birth year] DOA: 03/22/2024     3 DOS: the patient was seen and examined on 03/25/2024   Brief hospital course: Ms. Laurajean Hosek is a 48 year old female with history of GERD, anxiety, who presents ED for chief concerns of dizziness.  MRI of the brain showed numerous supratentorial and infratentorial brain lesions highly suspicious for demyelinating disease/multiple sclerosis.    Patient is seen by neurology, started on high-dose steroids for flare of multiple sclerosis for 5 days.   Principal Problem:   Multiple sclerosis Active Problems:   Anxiety   Urinary urgency   Hypokalemia   Assessment and Plan:  Multiple sclerosis, new diagnosis with flare Started Solu-Medrol 1 g daily since 10/16.  Will complete 5 days, final dose on Monday.  Dizziness essentially resolved. Doing well, able to walk in the hallway without any difficulty.  Patient will complete the 5-day steroids by tomorrow.  Discharge then.   Hypokalemia Resolved.   Urinary urgency No urinary tract infection.  Follow-up with PCP as outpatient.   Anxiety Continue home medicines.        Subjective:  Still has some headaches, but mild.  No dizziness.  Physical Exam: Vitals:   03/24/24 0725 03/24/24 1356 03/25/24 0323 03/25/24 0833  BP: 119/73 124/77 99/60 122/80  Pulse: 89 87 74 73  Resp: 16 17 17 16   Temp: 98.2 F (36.8 C) 98.4 F (36.9 C) 98.1 F (36.7 C) 98.3 F (36.8 C)  TempSrc: Oral     SpO2: 99% 98% 99% 100%  Weight:      Height:       General exam: Appears calm and comfortable  Respiratory system: Clear to auscultation. Respiratory effort normal. Cardiovascular system: S1 & S2 heard, RRR. No JVD, murmurs, rubs, gallops or clicks. No pedal edema. Gastrointestinal system: Abdomen is nondistended, soft and nontender. No organomegaly or masses felt. Normal bowel sounds heard. Central nervous system: Alert and oriented. No focal  neurological deficits. Extremities: Symmetric 5 x 5 power. Skin: No rashes, lesions or ulcers Psychiatry: Judgement and insight appear normal. Mood & affect appropriate.    Data Reviewed:  Labs reviewed  Family Communication: Husband updated at bedside  Disposition: Status is: Inpatient Remains inpatient appropriate because: Severity of disease, IV treatment.     Time spent: 35 minutes  Author: Murvin Mana, MD 03/25/2024 10:40 AM  For on call review [email or web address].

## 2024-03-25 NOTE — Progress Notes (Signed)
 NEUROLOGY CONSULT FOLLOW UP NOTE   Date of service: March 25, 2024 Patient Name: Erica Fowler MRN:  [id number] DOB:  [birth year]  Interval Hx/subjective  Continued improvement today. Her 4th dose of IV methylprednisolone is currently infusing.    Vitals   Vitals:   03/24/24 0725 03/24/24 1356 03/25/24 0323 03/25/24 0833  BP: 119/73 124/77 99/60 122/80  Pulse: 89 87 74 73  Resp: 16 17 17 16   Temp: 98.2 F (36.8 C) 98.4 F (36.9 C) 98.1 F (36.7 C) 98.3 F (36.8 C)  TempSrc: Oral     SpO2: 99% 98% 99% 100%  Weight:      Height:         Body mass index is 25.14 kg/m.  Physical Exam   Constitutional: Appears well-developed and well-nourished.  Psych: Bright affect today Eyes: No scleral injection.  HENT: No OP obstruction.  Head: Normocephalic.  Respiratory: Effort normal, non-labored breathing.  Skin: WDI.   Neurologic Examination   Mental Status: Awake and alert. Fully oriented. Thought content appropriate. Speech fluent without evidence of aphasia.  Able to follow all commands without difficulty. No dysarthria.  Cranial Nerves: II, III,IV, VI: No ptosis. Fixates and tracks normally.  VII: Smile symmetric VIII: Hearing intact to voice IX,X: No hypophonia or hoarseness XI: Symmetric XII: No lingual dysarthria Motor: Moves all 4 limbs equally Cerebellar: No ataxia noted  Gait: Deferred  Medications  Current Facility-Administered Medications:    acetaminophen (TYLENOL) tablet 650 mg, 650 mg, Oral, Q6H PRN, 650 mg at 03/25/24 1000 **OR** acetaminophen (TYLENOL) suppository 650 mg, 650 mg, Rectal, Q6H PRN, Cox, Amy N, DO   cholecalciferol (VITAMIN D3) 25 MCG (1000 UNIT) tablet 1,000 Units, 1,000 Units, Oral, Daily, Serayah Yazdani, MD, 1,000 Units at 03/25/24 9157   cyanocobalamin (VITAMIN B12) tablet 1,000 mcg, 1,000 mcg, Oral, Daily, Cox, Amy N, DO, 1,000 mcg at 03/25/24 0842   enoxaparin (LOVENOX) injection 40 mg, 40 mg, Subcutaneous, Q24H, Cox, Amy N, DO,  40 mg at 03/24/24 2033   fluticasone  (FLONASE ) 50 MCG/ACT nasal spray 2 spray, 2 spray, Each Nare, Daily PRN, Cox, Amy N, DO   hydrALAZINE (APRESOLINE) injection 5 mg, 5 mg, Intravenous, Q6H PRN, Cox, Amy N, DO   loratadine (CLARITIN) tablet 10 mg, 10 mg, Oral, Daily, Zhang, Dekui, MD, 10 mg at 03/25/24 0842   LORazepam (ATIVAN) tablet 0.5 mg, 0.5 mg, Oral, Q6H PRN, Cox, Amy N, DO   melatonin tablet 5 mg, 5 mg, Oral, QHS PRN, Cox, Amy N, DO   methylPREDNISolone sodium succinate (SOLU-MEDROL) 1,000 mg in sodium chloride 0.9 % 50 mL IVPB, 1,000 mg, Intravenous, Daily, Rodolphe Edmonston, MD, Last Rate: 66 mL/hr at 03/25/24 0955, 1,000 mg at 03/25/24 0955   ondansetron (ZOFRAN) tablet 4 mg, 4 mg, Oral, Q6H PRN **OR** ondansetron (ZOFRAN) injection 4 mg, 4 mg, Intravenous, Q6H PRN, Cox, Amy N, DO   venlafaxine  XR (EFFEXOR -XR) 24 hr capsule 150 mg, 150 mg, Oral, Q breakfast, Cox, Amy N, DO, 150 mg at 03/25/24 0842  Labs and Diagnostic Imaging   CBC:  Recent Labs  Lab 03/22/24 1046 03/23/24 0456  WBC 6.5 6.7  HGB 14.3 14.1  HCT 43.8 43.2  MCV 90.7 90.4  PLT 311 281    Basic Metabolic Panel:  Lab Results  Component Value Date   NA 140 03/23/2024   K 4.0 03/23/2024   CO2 23 03/23/2024   GLUCOSE 130 (H) 03/23/2024   BUN 14 03/23/2024   CREATININE 0.78 03/23/2024  CALCIUM 8.9 03/23/2024   GFRNONAA >60 03/23/2024   GFRAA 124 11/13/2019   Lipid Panel:  Lab Results  Component Value Date   LDLCALC 163 (H) 08/12/2023   HgbA1c:  Lab Results  Component Value Date   HGBA1C 5.4 08/12/2023     Assessment  48 y.o. female with a PMHx of headache, GERD, anxiety and allergy who presented to the ED on Thursday with dizziness, gait unsteadiness and difficulty concentrating on others' speech. She was unable to comprehend what people were saying to her on Wednesday and also stated that I just don't feel right. No pain reported. MRI brain in the ED revealed multifocal infratentorial and  supratentorial WM lesions with a distribution and morphologies most consistent with MS; no enhancing lesions seen.  - Exam today reveals continued improvement.  - Symptomatically, her gait and dysequilibrium symptoms are resolved after 3 doses of IV methylprednisolone.  - MRI brain w/wo contrast: Numerous supratentorial and infratentorial brain lesions highly suspicious for demyelinating disease/multiple sclerosis. No abnormal enhancement. - MRI C-spine w/wo contrast: Short segment T2 hyperintense lesion in the left ventrolateral cervical cord at C2. Possible additional signal abnormality in the left dorsolateral cord at C5. Findings concerning for demyelinating process. No enhancement to suggest active demyelination. Focal lesion within the medulla without abnormal enhancement, likely related to demyelinating process. Degenerative changes with mild spinal canal stenoses at 3 levels and moderate right neural foraminal stenosis at C5-C6. - Labs: - Vitamin D  level was low at 29.81. Per the literature, the risk of relapse in MS is inversely correlated with serum vitamin D  level. The risk of developing MS is also increased in individuals with lower vitamin D  levels.  - Anti-NMO antibody: Negative  - Prior ANA titer in 2019 was negative. ANA panel ordered during this admission is negative - Anti-MOG antibody pending - Impression: MRI findings and her history of recurrent neurological deficits corresponding to separate localizations along the neuraxis are consistent with lesions separated in time and space. MS is currently highest on the DDx given negative NMO Ab and lupus panel.   Recommendations  - Continue IV Solumedrol 1000 mg every day x 5 days. Today will be dose 4/5.  - Monitor CBG while on steroids. Also obtain daily BMP and CBC - 50,000 international units of vitamin D2 was administered on Friday - Starting vitamin D3 oral supplementation, 1000 international units daily. Continue indefinitely.  -  PT/OT - Will need outpatient Neurology follow up after discharge. Text message sent to Dr. Vear on Sunday evening.  - Anticipate discharge after her last dose of Solumedrol on Monday.  ______________________________________________________________________   Bonney SHARK, Myers Tutterow, MD [facility]

## 2024-03-25 NOTE — Plan of Care (Signed)

## 2024-03-26 ENCOUNTER — Other Ambulatory Visit: Payer: Self-pay

## 2024-03-26 LAB — GLUCOSE, CAPILLARY: Glucose-Capillary: 102 mg/dL — ABNORMAL HIGH (ref 70–99)

## 2024-03-26 LAB — MISC LABCORP TEST (SEND OUT)
LabCorp test name: 505310
Labcorp test code: 505310

## 2024-03-26 MED ORDER — VITAMIN D3 25 MCG PO TABS
1000.0000 [IU] | ORAL_TABLET | Freq: Every day | ORAL | 0 refills | Status: AC
  Filled 2024-03-26: qty 30, 30d supply, fill #0

## 2024-03-26 NOTE — Plan of Care (Signed)

## 2024-03-26 NOTE — Discharge Summary (Signed)
 Physician Discharge Summary   Patient: Erica Fowler MRN: [id number] DOB: 01-14-1976  Admit date:     03/22/2024  Discharge date: 03/26/24  Discharge Physician: Murvin Mana   PCP: Melvin Pao, NP   Recommendations at discharge:   Follow-up with PCP in 1 week. Follow-up with neurology as scheduled  Discharge Diagnoses: Principal Problem:   Multiple sclerosis Active Problems:   Anxiety   Urinary urgency   Hypokalemia Vitamin D  deficiency. Resolved Problems:   * No resolved hospital problems. [facility] Course: Erica Fowler is a 48 year old female with history of GERD, anxiety, who presents ED for chief concerns of dizziness.  MRI of the brain showed numerous supratentorial and infratentorial brain lesions highly suspicious for demyelinating disease/multiple sclerosis.    Patient is seen by neurology, started on high-dose steroids for flare of multiple sclerosis for 5 days. Patient completed 5 days of Solu-Medrol, symptom resolved.  Patient will be discharged with follow-up with neurology as outpatient  Assessment and Plan:  Multiple sclerosis, new diagnosis with flare Started Solu-Medrol 1 g daily since 10/16.  Will complete 5 days, final dose on Monday.  Dizziness essentially resolved. Doing well, able to walk in the hallway without any difficulty.  Patient will finish the fifth day of IV steroids today, medically stable for discharge after.   Hypokalemia Resolved.   Urinary urgency No urinary tract infection.  Follow-up with PCP as outpatient.   Anxiety Continue home medicines.        Consultants: Neurology Procedures performed: None  Disposition: Home Diet recommendation:  Discharge Diet Orders (From admission, onward)     Start     Ordered   03/26/24 0000  Diet - low sodium heart healthy        03/26/24 0902           Cardiac diet DISCHARGE MEDICATION: Allergies as of 03/26/2024       Reactions   Sulfa Antibiotics Other (See  Comments)   Childhood allergy per patient        Medication List     STOP taking these medications    Clotrimazole  Anti-Fungal 1 % cream Generic drug: clotrimazole    diphenhydrAMINE 25 mg capsule Commonly known as: BENADRYL       TAKE these medications    acetaminophen 500 MG tablet Commonly known as: TYLENOL Take 500 mg by mouth every 6 (six) hours as needed.   cyanocobalamin 1000 MCG tablet Commonly known as: VITAMIN B12 Take 1,000 mcg by mouth daily.   fexofenadine 180 MG tablet Commonly known as: ALLEGRA Take 180 mg by mouth daily.   fluticasone  50 MCG/ACT nasal spray Commonly known as: FLONASE  Place 2 sprays into both nostrils daily.   levonorgestrel  20 MCG/24HR IUD Commonly known as: MIRENA  1 each by Intrauterine route once.   SUDAFED PO Take by mouth.   venlafaxine  XR 150 MG 24 hr capsule Commonly known as: EFFEXOR -XR Take 1 capsule (150 mg total) by mouth daily with breakfast.   Vitamin D  (Ergocalciferol ) 1.25 MG (50000 UNIT) Caps capsule Commonly known as: DRISDOL  Take 1 capsule (50,000 Units total) by mouth every 7 (seven) days.   vitamin D3 25 MCG tablet Commonly known as: CHOLECALCIFEROL Take 1 tablet (1,000 Units total) by mouth daily.        Follow-up Information     Melvin Pao, NP Follow up in 1 week(s).   Specialty: Nurse Practitioner Contact information: [address] [place] [address] [phone]  Sater, Charlie LABOR, MD Follow up.   Specialty: Neurology Why: as scheduled Contact information: [address] [place] [address] [phone]                Discharge Exam: Erica Fowler   03/22/24 1042  Weight: 65 kg   General exam: Appears calm and comfortable  Respiratory system: Clear to auscultation. Respiratory effort normal. Cardiovascular system: S1 & S2 heard, RRR. No JVD, murmurs, rubs, gallops or clicks. No pedal edema. Gastrointestinal system: Abdomen is nondistended, soft and  nontender. No organomegaly or masses felt. Normal bowel sounds heard. Central nervous system: Alert and oriented. No focal neurological deficits. Extremities: Symmetric 5 x 5 power. Skin: No rashes, lesions or ulcers Psychiatry: Judgement and insight appear normal. Mood & affect appropriate.    Condition at discharge: good  The results of significant diagnostics from this hospitalization (including imaging, microbiology, ancillary and laboratory) are listed below for reference.   Imaging Studies: MR CERVICAL SPINE W WO CONTRAST Result Date: 03/23/2024 EXAM: MRI CERVICAL SPINE WITH AND WITHOUT CONTRAST 03/23/2024 02:14:06 PM TECHNIQUE: Multiplanar multisequence MRI of the cervical spine was performed without and with the administration of intravenous contrast. COMPARISON: None available. CLINICAL HISTORY: Demyelinating disease. Erica Fowler is a 48 year old female with history of GERD, anxiety, who presents ED for chief concerns of dizziness. MRI of the brain showed numerous supratentorial and infratentorial brain lesions highly suspicious for demyelinating disease/multiple sclerosis. Patient is seen by neurology, started on high-dose steroids for flare of multiple sclerosis for 5 days. FINDINGS: BONES AND ALIGNMENT: Straightening of the normal cervical lordosis. No traumatic malalignment. Normal vertebral body heights. Type 1 degenerative endplate changes with associated edema at C5-C6 likely secondary to degenerative changes. There is no signal abnormality within the adjacent C5-C6 disc. Additional subtle degenerative changes anteriorly at the C6-C7 endplates. Marrow signal is otherwise unremarkable. No abnormal enhancement. No evidence of fracture. No suspicious osseous lesion visualized. Posterior fossa is unremarkable. SPINAL CORD: The cervical cord is normal in caliber. There is a short segment lesion within the left lateral aspect of the cord at C2 best appreciated on series 8 image 10.  Possible additional focus of signal abnormality in the left dorsolateral cord at C5 on series 9 image 24. No abnormal enhancement within the cervical cord. There is a possible focus of signal abnormality within the medulla seen on series 7 image 8 which is not visualized on the axial images. SOFT TISSUES: No paraspinal mass. C2-C3: There is a small disc bulge. No significant spinal canal or foraminal stenosis. C3-C4: There is a right paracentral disc protrusion which indents the ventral thecal sac with mild flattening of the right ventral cord without cord signal abnormality. Thickening of the ligamentum flavum. Mild spinal canal stenosis. No significant foraminal stenosis. C4-C5: There is a central disc protrusion which indents the ventral thecal sac with mild flattening of the ventral cord without cord signal abnormality. Thickening of the ligamentum flavum. Mild spinal canal stenosis. Bilateral facet arthrosis and uncovertebral hypertrophy. Mild foraminal stenosis on the right. C5-C6: There is a disc osteophyte complex slightly eccentric to the right which indents the ventral thecal sac without contacting the spinal cord. Mild thickening of the ligamentum flavum and mild spinal canal stenosis. Facet arthrosis and uncovertebral hypertrophy on the right. There is moderate right foraminal stenosis. C6-C7: There is a small disc bulge. No significant spinal canal stenosis. No significant foraminal stenosis. C7-T1: There is no significant spinal canal or foraminal stenosis. IMPRESSION: 1. Short segment T2  hyperintense lesion in the left ventrolateral cervical cord at C2. Possible additional signal abnormality in the left dorsolateral cord at C5. Findings concerning for demyelinating process. 2. No enhancement to suggest active demyelination. 3. Focal lesion within the medulla without abnormal enhancement, likely related to demyelinating process. 4. Degenerative changes as above. Mild spinal canal stenosis at C3-C4,  C4-C5, and C5-C6. 5. Moderate right neural foraminal stenosis at C5-C6. 6. Prominent degenerative endplate changes at C5-6. Electronically signed by: Donnice Mania MD 03/23/2024 03:45 PM EDT RP Workstation: [id number]   MR Brain W and Wo Contrast Result Date: 03/22/2024 CLINICAL DATA:  Neuro deficit, acute, stroke suspected. Dizziness with eye movement. Leg weakness. Difficulty concentrating. EXAM: MRI HEAD WITHOUT AND WITH CONTRAST TECHNIQUE: Multiplanar, multiecho pulse sequences of the brain and surrounding structures were obtained without and with intravenous contrast. CONTRAST:  6mL GADAVIST GADOBUTROL 1 MMOL/ML IV SOLN COMPARISON:  None Available. FINDINGS: Brain: No definite acute infarct, intracranial hemorrhage, mass, midline shift, or extra-axial fluid collection is identified. Cerebral volume is normal. The ventricles are normal in size. There are greater than 20 small foci of T2 FLAIR hyperintensity involving the juxtacortical, deep, and periventricular white matter of both cerebral hemispheres. Multiple lesions are oriented perpendicularly to the lateral ventricles, and there is mild involvement of the corpus callosum. Lesions are also present in the brainstem and cerebellum including in the middle cerebellar peduncles and right medulla. Some lesions demonstrate T2 shine through on diffusion-weighted imaging, however no convincing restricted diffusion or abnormal enhancement is identified. Vascular: Major intracranial vascular flow voids are preserved. Skull and upper cervical spine: Unremarkable bone marrow signal. Sinuses/Orbits: Unremarkable orbits. Moderate mucosal thickening in the ethmoid sinuses and mild mucosal thickening elsewhere. Clear mastoid air cells. Other: None. IMPRESSION: Numerous supratentorial and infratentorial brain lesions highly suspicious for demyelinating disease/multiple sclerosis. No abnormal enhancement. Electronically Signed   By: Dasie Hamburg M.D.   On: 03/22/2024 16:19     Microbiology: Results for orders placed or performed in visit on 11/11/21  Urine Culture     Status: Abnormal   Collection Time: 11/11/21  2:14 PM   Specimen: Urine   UR  Result Value Ref Range Status   Urine Culture, Routine Final report (A)  Final   Organism ID, Bacteria Escherichia coli (A)  Final    Comment: Cefazolin <=4 ug/mL Cefazolin with an MIC <=16 predicts susceptibility to the oral agents cefaclor, cefdinir, cefpodoxime, cefprozil, cefuroxime, cephalexin, and loracarbef when used for therapy of uncomplicated urinary tract infections due to E. coli, Klebsiella pneumoniae, and Proteus mirabilis. 50,000-100,000 colony forming units per mL    Antimicrobial Susceptibility Comment  Final    Comment:       ** S = Susceptible; I = Intermediate; R = Resistant **                    P = Positive; N = Negative             MICS are expressed in micrograms per mL    Antibiotic                 RSLT#1    RSLT#2    RSLT#3    RSLT#4 Amoxicillin /Clavulanic Acid    S Ampicillin                     R Cefepime  S Ceftriaxone                    S Cefuroxime                     S Ciprofloxacin                   R Ertapenem                      S Gentamicin                     R Imipenem                       S Levofloxacin                   R Meropenem                      S Nitrofurantoin                  S Piperacillin/Tazobactam        S Tetracycline                   R Tobramycin                     I Trimethoprim/Sulfa             R   Microscopic Examination     Status: Abnormal   Collection Time: 11/11/21  2:14 PM   Urine  Result Value Ref Range Status   WBC, UA None seen 0 - 5 /hpf Final   RBC, Urine 0-2 0 - 2 /hpf Final   Epithelial Cells (non renal) 0-10 0 - 10 /hpf Final   Bacteria, UA Few (A) None seen/Few Final    Labs: CBC: Recent Labs  Lab 03/22/24 1046 03/23/24 0456  WBC 6.5 6.7  HGB 14.3 14.1  HCT 43.8 43.2  MCV 90.7 90.4  PLT  311 281   Basic Metabolic Panel: Recent Labs  Lab 03/22/24 1046 03/23/24 0456  NA 138 140  K 3.4* 4.0  CL 103 108  CO2 24 23  GLUCOSE 101* 130*  BUN 10 14  CREATININE 0.84 0.78  CALCIUM 9.4 8.9   Liver Function Tests: Recent Labs  Lab 03/22/24 1046  AST 35  ALT 21  ALKPHOS 43  BILITOT 0.8  PROT 8.0  ALBUMIN 4.7   CBG: Recent Labs  Lab 03/22/24 1543 03/23/24 1730 03/24/24 2208 03/25/24 0926 03/26/24 0710  GLUCAP 91 118* 148* 154* 102*    Discharge time spent: 35 minutes.  Signed: Murvin Mana, MD [facility] 03/26/2024

## 2024-03-26 NOTE — Progress Notes (Signed)
 IV's removed. D/C education completed. Volunteers called for transport.  Grayer Sproles V Manessa Buley

## 2024-03-26 NOTE — Plan of Care (Signed)

## 2024-03-30 DIAGNOSIS — H532 Diplopia: Secondary | ICD-10-CM | POA: Diagnosis not present

## 2024-03-30 DIAGNOSIS — G35D Multiple sclerosis, unspecified: Secondary | ICD-10-CM | POA: Diagnosis not present

## 2024-03-30 DIAGNOSIS — H5032 Intermittent alternating esotropia: Secondary | ICD-10-CM | POA: Diagnosis not present

## 2024-04-02 ENCOUNTER — Ambulatory Visit: Admitting: Nurse Practitioner

## 2024-04-02 ENCOUNTER — Other Ambulatory Visit: Payer: Self-pay | Admitting: Nurse Practitioner

## 2024-04-02 ENCOUNTER — Encounter: Payer: Self-pay | Admitting: Nurse Practitioner

## 2024-04-02 ENCOUNTER — Other Ambulatory Visit: Payer: Self-pay

## 2024-04-02 VITALS — BP 101/65 | HR 76 | Temp 99.2°F | Ht 63.3 in | Wt 147.4 lb

## 2024-04-02 DIAGNOSIS — Z09 Encounter for follow-up examination after completed treatment for conditions other than malignant neoplasm: Secondary | ICD-10-CM

## 2024-04-02 DIAGNOSIS — G35D Multiple sclerosis, unspecified: Secondary | ICD-10-CM | POA: Diagnosis not present

## 2024-04-02 DIAGNOSIS — R61 Generalized hyperhidrosis: Secondary | ICD-10-CM | POA: Diagnosis not present

## 2024-04-02 DIAGNOSIS — F439 Reaction to severe stress, unspecified: Secondary | ICD-10-CM

## 2024-04-02 DIAGNOSIS — F419 Anxiety disorder, unspecified: Secondary | ICD-10-CM

## 2024-04-02 MED ORDER — ROSUVASTATIN CALCIUM 5 MG PO TABS
5.0000 mg | ORAL_TABLET | Freq: Every day | ORAL | 1 refills | Status: AC
  Filled 2024-04-02: qty 90, 90d supply, fill #0
  Filled 2024-06-22: qty 90, 90d supply, fill #1

## 2024-04-02 NOTE — Progress Notes (Signed)
 BP 101/65   Pulse 76   Temp 99.2 F (37.3 C) (Oral)   Ht 5' 3.3 (1.608 m)   Wt 147 lb 6.4 oz (66.9 kg)   SpO2 100%   BMI 25.86 kg/m    Subjective:    Patient ID: Erica Fowler, female    DOB: [birth year], 48 y.o.   MRN: [id number]  HPI: Erica Fowler is a 48 y.o. female  Chief Complaint  Patient presents with   Hospitalization Follow-up    Patient states she was recently in the hospital and diagnosed with MS   Transition of Care Hospital Follow up.   Hospital/Facility: [facility] D/C Physician: Dr. Laurita D/C Date: 03/26/24  Records Requested: NA Records Received: Reviewed Records Reviewed: reviewed  Diagnoses on Discharge:   Multiple sclerosis, new diagnosis with flare Started Solu-Medrol 1 g daily since 10/16.  Will complete 5 days, final dose on Monday.  Dizziness essentially resolved. Doing well, able to walk in the hallway without any difficulty.  Patient will finish the fifth day of IV steroids today, medically stable for discharge after.   Hypokalemia Resolved.   Urinary urgency No urinary tract infection.  Follow-up with PCP as outpatient.   Anxiety Continue home medicines.   Patient states she is really struggling with night sweats.  She had the first good night sleep she has had in years. She has tried black cohosh and it seemed to help at first but no longer helping.     Date of interactive Contact within 48 hours of discharge:  Contact was through: none  Date of 7 day or 14 day face-to-face visit:    within 7 days  Outpatient Encounter Medications as of 04/02/2024  Medication Sig Note   acetaminophen (TYLENOL) 500 MG tablet Take 500 mg by mouth every 6 (six) hours as needed. 03/22/2024: prn   fexofenadine (ALLEGRA) 180 MG tablet Take 180 mg by mouth daily.    levonorgestrel  (MIRENA ) 20 MCG/24HR IUD 1 each by Intrauterine route once.    Pseudoephedrine HCl (SUDAFED PO) Take by mouth.    rosuvastatin (CRESTOR) 5 MG tablet Take 1 tablet (5 mg  total) by mouth daily.    venlafaxine  XR (EFFEXOR -XR) 150 MG 24 hr capsule Take 1 capsule (150 mg total) by mouth daily with breakfast.    vitamin B-12 (CYANOCOBALAMIN) 1000 MCG tablet Take 1,000 mcg by mouth daily.    vitamin D3 (CHOLECALCIFEROL) 25 MCG tablet Take 1 tablet (1,000 Units total) by mouth daily.    [DISCONTINUED] fluticasone  (FLONASE ) 50 MCG/ACT nasal spray Place 2 sprays into both nostrils daily. (Patient not taking: Reported on 03/22/2024)    [DISCONTINUED] Vitamin D , Ergocalciferol , (DRISDOL ) 1.25 MG (50000 UNIT) CAPS capsule Take 1 capsule (50,000 Units total) by mouth every 7 (seven) days.    No facility-administered encounter medications on file as of 04/02/2024.    Diagnostic Tests Reviewed/Disposition: Reviewed  Consults: Neurology  Discharge Instructions: Reviewed during visit  Disease/illness Education: Provided during visit  Home Health/Community Services Discussions/Referrals: Will see Neurology next week  Establishment or re-establishment of referral orders for community resources: NA  Discussion with other health care providers: NA  Assessment and Support of treatment regimen adherence: Discussed during visit  Appointments Coordinated with: with patient  Education for self-management, independent living, and ADLs: NA  Relevant past medical, surgical, family and social history reviewed and updated as indicated. Interim medical history since our last visit reviewed. Allergies and medications reviewed and updated.  Review of Systems  Endocrine: Positive  for heat intolerance.  Genitourinary:  Positive for urgency.  Psychiatric/Behavioral:  The patient is nervous/anxious.     Per HPI unless specifically indicated above     Objective:    BP 101/65   Pulse 76   Temp 99.2 F (37.3 C) (Oral)   Ht 5' 3.3 (1.608 m)   Wt 147 lb 6.4 oz (66.9 kg)   SpO2 100%   BMI 25.86 kg/m   Wt Readings from Last 3 Encounters:  04/02/24 147 lb 6.4 oz (66.9 kg)   03/22/24 143 lb 4.8 oz (65 kg)  08/12/23 143 lb 3.2 oz (65 kg)    Physical Exam Vitals and nursing note reviewed.  Constitutional:      General: She is not in acute distress.    Appearance: Normal appearance. She is normal weight. She is not ill-appearing, toxic-appearing or diaphoretic.  HENT:     Head: Normocephalic.     Right Ear: External ear normal.     Left Ear: External ear normal.     Nose: Nose normal.     Mouth/Throat:     Mouth: Mucous membranes are moist.     Pharynx: Oropharynx is clear.  Eyes:     General:        Right eye: No discharge.        Left eye: No discharge.     Extraocular Movements: Extraocular movements intact.     Conjunctiva/sclera: Conjunctivae normal.     Pupils: Pupils are equal, round, and reactive to light.  Cardiovascular:     Rate and Rhythm: Normal rate and regular rhythm.     Heart sounds: No murmur heard. Pulmonary:     Effort: Pulmonary effort is normal. No respiratory distress.     Breath sounds: Normal breath sounds. No wheezing or rales.  Musculoskeletal:     Cervical back: Normal range of motion and neck supple.  Skin:    General: Skin is warm and dry.     Capillary Refill: Capillary refill takes less than 2 seconds.  Neurological:     General: No focal deficit present.     Mental Status: She is alert and oriented to person, place, and time. Mental status is at baseline.  Psychiatric:        Mood and Affect: Mood normal.        Behavior: Behavior normal.        Thought Content: Thought content normal.        Judgment: Judgment normal.     Results for orders placed or performed during the hospital encounter of 03/22/24  Comprehensive metabolic panel   Collection Time: 03/22/24 10:46 AM  Result Value Ref Range   Sodium 138 135 - 145 mmol/L   Potassium 3.4 (L) 3.5 - 5.1 mmol/L   Chloride 103 98 - 111 mmol/L   CO2 24 22 - 32 mmol/L   Glucose, Bld 101 (H) 70 - 99 mg/dL   BUN 10 6 - 20 mg/dL   Creatinine, Ser 9.15 0.44 -  1.00 mg/dL   Calcium 9.4 8.9 - 89.6 mg/dL   Total Protein 8.0 6.5 - 8.1 g/dL   Albumin 4.7 3.5 - 5.0 g/dL   AST 35 15 - 41 U/L   ALT 21 0 - 44 U/L   Alkaline Phosphatase 43 38 - 126 U/L   Total Bilirubin 0.8 0.0 - 1.2 mg/dL   GFR, Estimated >39 >39 mL/min   Anion gap 11 5 - 15  CBC   Collection Time: 03/22/24  10:46 AM  Result Value Ref Range   WBC 6.5 4.0 - 10.5 K/uL   RBC 4.83 3.87 - 5.11 MIL/uL   Hemoglobin 14.3 12.0 - 15.0 g/dL   HCT 56.1 63.9 - 53.9 %   MCV 90.7 80.0 - 100.0 fL   MCH 29.6 26.0 - 34.0 pg   MCHC 32.6 30.0 - 36.0 g/dL   RDW 87.3 88.4 - 84.4 %   Platelets 311 150 - 400 K/uL   nRBC 0.0 0.0 - 0.2 %  Urinalysis, Routine w reflex microscopic -Urine, Clean Catch   Collection Time: 03/22/24 11:24 AM  Result Value Ref Range   Color, Urine YELLOW (A) YELLOW   APPearance HAZY (A) CLEAR   Specific Gravity, Urine 1.014 1.005 - 1.030   pH 5.0 5.0 - 8.0   Glucose, UA NEGATIVE NEGATIVE mg/dL   Hgb urine dipstick NEGATIVE NEGATIVE   Bilirubin Urine NEGATIVE NEGATIVE   Ketones, ur 5 (A) NEGATIVE mg/dL   Protein, ur NEGATIVE NEGATIVE mg/dL   Nitrite POSITIVE (A) NEGATIVE   Leukocytes,Ua NEGATIVE NEGATIVE   RBC / HPF 0-5 0 - 5 RBC/hpf   WBC, UA 0-5 0 - 5 WBC/hpf   Bacteria, UA RARE (A) NONE SEEN   Squamous Epithelial / HPF 6-10 0 - 5 /HPF   Mucus PRESENT   POC urine preg, ED   Collection Time: 03/22/24 11:37 AM  Result Value Ref Range   Preg Test, Ur Negative Negative  CBG monitoring, ED   Collection Time: 03/22/24  3:43 PM  Result Value Ref Range   Glucose-Capillary 91 70 - 99 mg/dL  Miscellaneous [facility] test (send-out)   Collection Time: 03/22/24  8:50 PM  Result Value Ref Range   [facility] test code [id number]    [facility] test name [id number]    Misc [facility] result COMMENT   Neuromyelitis optica autoab, IgG   Collection Time: 03/22/24  9:51 PM  Result Value Ref Range   NMO-IgG <1.5 0.0 - 3.0 U/mL  ANA Comprehensive Panel   Collection Time: 03/22/24  9:51 PM   Result Value Ref Range   ds DNA Ab <1 0 - 9 IU/mL   Ribonucleic Protein <0.2 0.0 - 0.9 AI   ENA SM Ab Ser-aCnc <0.2 0.0 - 0.9 AI   Scleroderma (Scl-70) (ENA) Antibody, IgG <0.2 0.0 - 0.9 AI   SSA (Ro) (ENA) Antibody, IgG <0.2 0.0 - 0.9 AI   SSB (La) (ENA) Antibody, IgG <0.2 0.0 - 0.9 AI   Chromatin Ab SerPl-aCnc <0.2 0.0 - 0.9 AI   Anti JO-1 <0.2 0.0 - 0.9 AI   Centromere Ab Screen <0.2 0.0 - 0.9 AI   See below: Comment   VITAMIN D  25 Hydroxy (Vit-D Deficiency, Fractures)   Collection Time: 03/22/24  9:51 PM  Result Value Ref Range   Vit D, 25-Hydroxy 29.81 (L) 30 - 100 ng/mL  Basic metabolic panel   Collection Time: 03/23/24  4:56 AM  Result Value Ref Range   Sodium 140 135 - 145 mmol/L   Potassium 4.0 3.5 - 5.1 mmol/L   Chloride 108 98 - 111 mmol/L   CO2 23 22 - 32 mmol/L   Glucose, Bld 130 (H) 70 - 99 mg/dL   BUN 14 6 - 20 mg/dL   Creatinine, Ser 9.21 0.44 - 1.00 mg/dL   Calcium 8.9 8.9 - 89.6 mg/dL   GFR, Estimated >39 >39 mL/min   Anion gap 9 5 - 15  CBC   Collection Time: 03/23/24  4:56 AM  Result Value Ref Range   WBC 6.7 4.0 - 10.5 K/uL   RBC 4.78 3.87 - 5.11 MIL/uL   Hemoglobin 14.1 12.0 - 15.0 g/dL   HCT 56.7 63.9 - 53.9 %   MCV 90.4 80.0 - 100.0 fL   MCH 29.5 26.0 - 34.0 pg   MCHC 32.6 30.0 - 36.0 g/dL   RDW 87.6 88.4 - 84.4 %   Platelets 281 150 - 400 K/uL   nRBC 0.0 0.0 - 0.2 %  Glucose, capillary   Collection Time: 03/23/24  5:30 PM  Result Value Ref Range   Glucose-Capillary 118 (H) 70 - 99 mg/dL  Glucose, capillary   Collection Time: 03/24/24 10:08 PM  Result Value Ref Range   Glucose-Capillary 148 (H) 70 - 99 mg/dL   Comment 1 Notify RN   Glucose, capillary   Collection Time: 03/25/24  9:26 AM  Result Value Ref Range   Glucose-Capillary 154 (H) 70 - 99 mg/dL   Comment 1 Notify RN    Comment 2 Document in Chart   Glucose, capillary   Collection Time: 03/26/24  7:10 AM  Result Value Ref Range   Glucose-Capillary 102 (H) 70 - 99 mg/dL       Assessment & Plan:   Problem List Items Addressed This Visit       Nervous and Auditory   Multiple sclerosis   New diagnosis during recent hospitalization. Has an appointment on Monday November 3 with Neurology.        Other   Anxiety   Chronic.  Controlled.  Continue with current medication regimen of Effexor  150mg .  Labs ordered today.  Return to clinic in 6 months for reevaluation.  Call sooner if concerns arise.        Other Visit Diagnoses       Hospital discharge follow-up    -  Primary   Unable to bill as HFU due to no phone call.  However, Patient is doing well. Labs and imaging reviewed.     Night sweats       Perimenopause vs MS symptoms. Will refer to GYN due to patient having the mirena .        Follow up plan: Return in about 1 month (around 05/03/2024) for Medication Management.   A total of 30 minutes were spent on this encounter today.  When total time is documented, this includes both the face-to-face and non-face-to-face time personally spent before, during and after the visit on the date of the encounter discussing symptoms, referrals, plan of care and follow up.

## 2024-04-02 NOTE — Assessment & Plan Note (Signed)
 New diagnosis during recent hospitalization. Has an appointment on Monday November 3 with Neurology.

## 2024-04-02 NOTE — Assessment & Plan Note (Signed)
 Chronic.  Controlled.  Continue with current medication regimen of Effexor  150mg .  Labs ordered today.  Return to clinic in 6 months for reevaluation.  Call sooner if concerns arise.

## 2024-04-03 ENCOUNTER — Other Ambulatory Visit: Payer: Self-pay | Admitting: Nurse Practitioner

## 2024-04-03 ENCOUNTER — Other Ambulatory Visit: Payer: Self-pay

## 2024-04-03 DIAGNOSIS — F439 Reaction to severe stress, unspecified: Secondary | ICD-10-CM

## 2024-04-03 MED ORDER — FLUTICASONE PROPIONATE 50 MCG/ACT NA SUSP
2.0000 | Freq: Every day | NASAL | 6 refills | Status: AC
  Filled 2024-04-03: qty 16, 30d supply, fill #0
  Filled 2024-06-22: qty 16, 30d supply, fill #1

## 2024-04-05 ENCOUNTER — Other Ambulatory Visit: Payer: Self-pay

## 2024-04-05 MED FILL — Venlafaxine HCl Cap ER 24HR 150 MG (Base Equivalent): ORAL | 90 days supply | Qty: 90 | Fill #0 | Status: AC

## 2024-04-05 NOTE — Telephone Encounter (Signed)
 Requested Prescriptions  Pending Prescriptions Disp Refills   venlafaxine  XR (EFFEXOR -XR) 150 MG 24 hr capsule 90 capsule 1    Sig: Take 1 capsule (150 mg total) by mouth daily with breakfast.     Psychiatry: Antidepressants - SNRI - desvenlafaxine & venlafaxine  Failed - 04/05/2024 12:09 PM      Failed - Lipid Panel in normal range within the last 12 months    Cholesterol, Total  Date Value Ref Range Status  08/12/2023 237 (H) 100 - 199 mg/dL Final   LDL Chol Calc (NIH)  Date Value Ref Range Status  08/12/2023 163 (H) 0 - 99 mg/dL Final   LDL Direct  Date Value Ref Range Status  11/13/2019 117 (H) 0 - 99 mg/dL Final   HDL  Date Value Ref Range Status  08/12/2023 62 >39 mg/dL Final   Triglycerides  Date Value Ref Range Status  08/12/2023 71 0 - 149 mg/dL Final         Passed - Cr in normal range and within 360 days    Creatinine, Ser  Date Value Ref Range Status  03/23/2024 0.78 0.44 - 1.00 mg/dL Final         Passed - Last BP in normal range    BP Readings from Last 1 Encounters:  04/02/24 101/65         Passed - Valid encounter within last 6 months    Recent Outpatient Visits           3 days ago Hospital discharge follow-up    [facility] Melvin Pao, NP   7 months ago Annual physical exam    [facility] Melvin Pao, NP

## 2024-04-08 NOTE — Progress Notes (Unsigned)
 [facility]  PATIENT: Erica Fowler DOB: 1976-01-06  REFERRING DOCTOR OR PCP: Darice Petty, NP SOURCE: Patient, note from emergency room/hospitalization, imaging and lab reports, MRI images personally reviewed.  _________________________________   HISTORICAL  CHIEF COMPLAINT:  No chief complaint on file.   HISTORY OF PRESENT ILLNESS:  I had the pleasure of seeing your patient, Erica Fowler, at the MS center [facility] for neurologic consultation regarding her recent diagnosis of MS.  She is a 48 year old woman in generally good health who presented to the urgent care/emergency room on 03/22/2024 with dizziness and leg weakness that began 1 day earlier.  The dizziness was more a sensation of feeling off balance.  Due to unsteadiness at the urgent care she was taken to the emergency room.  MRI of the brain showed multiple T2/FLAIR hyperintense foci consistent with chronic demyelination associated with multiple sclerosis.  There were no enhancing lesions.  Some foci were in the infratentorial white matter.  She was admitted for 5 days of IV Solu-Medrol.  MRI of the cervical spine showed  She was admitted and received 5 days of IV Solu-Medrol.  Symptoms improved and she was discharged on 03/26/2024  Family history of autoimmune  Saw neurology at [facility] in 2019 for numbness  Laboratory: October 2025: Vitamin D  was mildly reduced at 29, ANA was negative, anti-NMO and anti-MOG antibodies were negative, CBC (no differential) and CMP were unremarkable.   Imaging: MRI of the brain with and without contrast 03/22/2024 shows multiple T2/FLAIR hyperintense foci in the periventricular, juxtacortical and deep white matter of the cerebral hemispheres.  Additional foci are noted in the pons, bilateral middle cerebellar peduncles, left greater than right, left periventricular cerebellum, right medulla.  There is a normal enhancement pattern.  The  cervical spine with and without contrast 03/23/2024 showed T2 hyperintense foci in the brainstem, adjacent to C2 to the left, adjacent to T2 to the right and adjacent to T4-T5 (these latter 2 images are just seen on the sagittal views as not covered by the axial views.  Possibly a lesion posterolaterally to the left adjacent to C5.  Level degenerative change causing mild spinal stenosis at C4-C5, C5-C6 and C6-C7.  There is significant foraminal narrowing to the right at C5-C6 at the C6 nerve root.  REVIEW OF SYSTEMS: Constitutional: No fevers, chills, sweats, or change in appetite Eyes: No visual changes, double vision, eye pain Ear, nose and throat: No hearing loss, ear pain, nasal congestion, sore throat Cardiovascular: No chest pain, palpitations Respiratory:  No shortness of breath at rest or with exertion.   No wheezes GastrointestinaI: No nausea, vomiting, diarrhea, abdominal pain, fecal incontinence.  History of GERD Genitourinary:  No dysuria, urinary retention or frequency.  No nocturia. Musculoskeletal:  No neck pain, back pain Integumentary: No rash, pruritus, skin lesions Neurological: as above Psychiatric: No depression at this time.  History of anxiety. Endocrine: No palpitations, diaphoresis, change in appetite, change in weigh or increased thirst Hematologic/Lymphatic:  No anemia, purpura, petechiae. Allergic/Immunologic: No itchy/runny eyes, nasal congestion, recent allergic reactions, rashes  ALLERGIES: Allergies  Allergen Reactions   Sulfa Antibiotics Other (See Comments)    Childhood allergy per patient    HOME MEDICATIONS:  Current Outpatient Medications:    acetaminophen (TYLENOL) 500 MG tablet, Take 500 mg by mouth every 6 (six) hours as needed., Disp: , Rfl:    fexofenadine (ALLEGRA) 180 MG tablet, Take 180 mg by mouth daily., Disp: , Rfl:    fluticasone  (FLONASE )  50 MCG/ACT nasal spray, Place 2 sprays into both nostrils daily., Disp: 16 g, Rfl: 6    levonorgestrel  (MIRENA ) 20 MCG/24HR IUD, 1 each by Intrauterine route once., Disp: , Rfl:    Pseudoephedrine HCl (SUDAFED PO), Take by mouth., Disp: , Rfl:    rosuvastatin (CRESTOR) 5 MG tablet, Take 1 tablet (5 mg total) by mouth daily., Disp: 90 tablet, Rfl: 1   venlafaxine  XR (EFFEXOR -XR) 150 MG 24 hr capsule, Take 1 capsule (150 mg total) by mouth daily with breakfast., Disp: 90 capsule, Rfl: 1   vitamin B-12 (CYANOCOBALAMIN) 1000 MCG tablet, Take 1,000 mcg by mouth daily., Disp: , Rfl:    vitamin D3 (CHOLECALCIFEROL) 25 MCG tablet, Take 1 tablet (1,000 Units total) by mouth daily., Disp: 30 tablet, Rfl: 0  PAST MEDICAL HISTORY: Past Medical History:  Diagnosis Date   Allergy    seasonal   Anxiety    GERD (gastroesophageal reflux disease)    Headache     PAST SURGICAL HISTORY: Past Surgical History:  Procedure Laterality Date   CESAREAN SECTION  1997   LAPAROTOMY Left 06/07/2008    FAMILY HISTORY: Family History  Problem Relation Age of Onset   COPD Mother    Heart disease Father    Cancer Father        Prostate   Hyperlipidemia Father    Autoimmune disease Sister    Breast cancer Paternal Aunt    COPD Maternal Grandmother    Diabetes Maternal Grandmother    Renal Disease Maternal Grandmother    Heart disease Maternal Grandmother    Hyperlipidemia Maternal Grandmother    Hypertension Maternal Grandmother    Kidney disease Maternal Grandmother    Stroke Maternal Grandmother    Diabetes Maternal Grandfather    Heart disease Maternal Grandfather    Hypertension Paternal Grandmother    Stroke Paternal Grandmother    Breast cancer Paternal Grandmother    Cancer Paternal Grandmother    Heart disease Paternal Grandfather    Lung cancer Paternal Grandfather    Cancer Paternal Grandfather    Diabetes Paternal Grandfather    Hyperlipidemia Paternal Grandfather    Hypertension Paternal Grandfather     SOCIAL HISTORY: Social History   Socioeconomic History    Marital status: Married    Spouse name: Not on file   Number of children: Not on file   Years of education: Not on file   Highest education level: Master's degree (e.g., MA, MS, MEng, MEd, MSW, MBA)  Occupational History   Not on file  Tobacco Use   Smoking status: Former    Current packs/day: 0.50    Average packs/day: 0.5 packs/day for 20.0 years (10.0 ttl pk-yrs)    Types: Cigarettes   Smokeless tobacco: Never  Vaping Use   Vaping status: Every Day  Substance and Sexual Activity   Alcohol use: Yes    Comment: occasional   Drug use: No   Sexual activity: Yes    Birth control/protection: I.U.D.    Comment: mirena   Other Topics Concern   Not on file  Social History Narrative   Not on file   Social Drivers of Health   Financial Resource Strain: Low Risk  (08/12/2023)   Overall Financial Resource Strain (CARDIA)    Difficulty of Paying Living Expenses: Not hard at all  Food Insecurity: No Food Insecurity (03/22/2024)   Hunger Vital Sign    Worried About Running Out of Food in the Last Year: Never true    Ran Out  of Food in the Last Year: Never true  Transportation Needs: No Transportation Needs (03/22/2024)   PRAPARE - Administrator, Civil Service (Medical): No    Lack of Transportation (Non-Medical): No  Physical Activity: Inactive (08/12/2023)   Exercise Vital Sign    Days of Exercise per Week: 0 days    Minutes of Exercise per Session: 0 min  Stress: Stress Concern Present (08/12/2023)   [employer] of Occupational Health - Occupational Stress Questionnaire    Feeling of Stress : To some extent  Social Connections: Moderately Isolated (03/22/2024)   Social Connection and Isolation Panel    Frequency of Communication with Friends and Family: Twice a week    Frequency of Social Gatherings with Friends and Family: Once a week    Attends Religious Services: Never    Database Administrator or Organizations: No    Attends Engineer, Structural: Not  on file    Marital Status: Married  Catering Manager Violence: Not At Risk (03/22/2024)   Humiliation, Afraid, Rape, and Kick questionnaire    Fear of Current or Ex-Partner: No    Emotionally Abused: No    Physically Abused: No    Sexually Abused: No       PHYSICAL EXAM  There were no vitals filed for this visit.  There is no height or weight on file to calculate BMI.   General: The patient is well-developed and well-nourished and in no acute distress  HEENT:  Head is /AT.  Sclera are anicteric.  Funduscopic exam shows normal optic discs and retinal vessels.  Neck: No carotid bruits are noted.  The neck is nontender.  Cardiovascular: The heart has a regular rate and rhythm with a normal S1 and S2. There were no murmurs, gallops or rubs.    Skin: Extremities are without rash or  edema.  Musculoskeletal:  Back is nontender  Neurologic Exam  Mental status: The patient is alert and oriented x 3 at the time of the examination. The patient has apparent normal recent and remote memory, with an apparently normal attention span and concentration ability.   Speech is normal.  Cranial nerves: Extraocular movements are full. Pupils are equal, round, and reactive to light and accomodation.  Visual fields are full.  Facial symmetry is present. There is good facial sensation to soft touch bilaterally.Facial strength is normal.  Trapezius and sternocleidomastoid strength is normal. No dysarthria is noted.  The tongue is midline, and the patient has symmetric elevation of the soft palate. No obvious hearing deficits are noted.  Motor:  Muscle bulk is normal.   Tone is normal. Strength is  5 / 5 in all 4 extremities.   Sensory: Sensory testing is intact to pinprick, soft touch and vibration sensation in all 4 extremities.  Coordination: Cerebellar testing reveals good finger-nose-finger and heel-to-shin bilaterally.  Gait and station: Station is normal.   Gait is normal. Tandem gait is  normal. Romberg is negative.   Reflexes: Deep tendon reflexes are symmetric and normal bilaterally.   Plantar responses are flexor.    DIAGNOSTIC DATA (LABS, IMAGING, TESTING) - I reviewed patient records, labs, notes, testing and imaging myself where available.  Lab Results  Component Value Date   WBC 6.7 03/23/2024   HGB 14.1 03/23/2024   HCT 43.2 03/23/2024   MCV 90.4 03/23/2024   PLT 281 03/23/2024      Component Value Date/Time   NA 140 03/23/2024 0456   NA 137 08/12/2023 1055  K 4.0 03/23/2024 0456   CL 108 03/23/2024 0456   CO2 23 03/23/2024 0456   GLUCOSE 130 (H) 03/23/2024 0456   BUN 14 03/23/2024 0456   BUN 13 08/12/2023 1055   CREATININE 0.78 03/23/2024 0456   CALCIUM 8.9 03/23/2024 0456   PROT 8.0 03/22/2024 1046   PROT 7.1 08/12/2023 1055   ALBUMIN 4.7 03/22/2024 1046   ALBUMIN 4.7 08/12/2023 1055   AST 35 03/22/2024 1046   ALT 21 03/22/2024 1046   ALKPHOS 43 03/22/2024 1046   BILITOT 0.8 03/22/2024 1046   BILITOT 0.3 08/12/2023 1055   GFRNONAA >60 03/23/2024 0456   GFRAA 124 11/13/2019 1448   Lab Results  Component Value Date   CHOL 237 (H) 08/12/2023   HDL 62 08/12/2023   LDLCALC 163 (H) 08/12/2023   LDLDIRECT 117 (H) 11/13/2019   TRIG 71 08/12/2023   CHOLHDL 3.8 08/12/2023   Lab Results  Component Value Date   HGBA1C 5.4 08/12/2023   No results found for: [id number] Lab Results  Component Value Date   TSH 0.810 08/12/2023       ASSESSMENT AND PLAN  ***   Kerigan Narvaez A. Vear, MD, [facility] 04/08/2024, 1:46 PM Certified in Neurology, Clinical Neurophysiology, Sleep Medicine and Neuroimaging  [facility] [address], [address] [place], [place] [address] [phone]

## 2024-04-09 ENCOUNTER — Encounter: Payer: Self-pay | Admitting: Neurology

## 2024-04-09 ENCOUNTER — Ambulatory Visit: Admitting: Neurology

## 2024-04-09 VITALS — BP 119/77 | HR 67 | Resp 15 | Ht 63.0 in | Wt 147.5 lb

## 2024-04-09 DIAGNOSIS — F419 Anxiety disorder, unspecified: Secondary | ICD-10-CM | POA: Diagnosis not present

## 2024-04-09 DIAGNOSIS — R3915 Urgency of urination: Secondary | ICD-10-CM

## 2024-04-09 DIAGNOSIS — R260 Ataxic gait: Secondary | ICD-10-CM | POA: Diagnosis not present

## 2024-04-09 DIAGNOSIS — Z79899 Other long term (current) drug therapy: Secondary | ICD-10-CM

## 2024-04-09 DIAGNOSIS — G35A Relapsing-remitting multiple sclerosis: Secondary | ICD-10-CM | POA: Diagnosis not present

## 2024-04-11 ENCOUNTER — Ambulatory Visit: Payer: Self-pay | Admitting: Neurology

## 2024-04-11 LAB — CBC WITH DIFFERENTIAL/PLATELET
Basophils Absolute: 0 x10E3/uL (ref 0.0–0.2)
Basos: 1 %
EOS (ABSOLUTE): 0.1 x10E3/uL (ref 0.0–0.4)
Eos: 1 %
Hematocrit: 43.6 % (ref 34.0–46.6)
Hemoglobin: 14.4 g/dL (ref 11.1–15.9)
Immature Grans (Abs): 0 x10E3/uL (ref 0.0–0.1)
Immature Granulocytes: 0 %
Lymphocytes Absolute: 1.7 x10E3/uL (ref 0.7–3.1)
Lymphs: 28 %
MCH: 30.1 pg (ref 26.6–33.0)
MCHC: 33 g/dL (ref 31.5–35.7)
MCV: 91 fL (ref 79–97)
Monocytes Absolute: 0.6 x10E3/uL (ref 0.1–0.9)
Monocytes: 10 %
Neutrophils Absolute: 3.8 x10E3/uL (ref 1.4–7.0)
Neutrophils: 60 %
Platelets: 321 x10E3/uL (ref 150–450)
RBC: 4.78 x10E6/uL (ref 3.77–5.28)
RDW: 12.2 % (ref 11.7–15.4)
WBC: 6.3 x10E3/uL (ref 3.4–10.8)

## 2024-04-11 LAB — HEPATITIS B CORE ANTIBODY, TOTAL: Hep B Core Total Ab: NEGATIVE

## 2024-04-11 LAB — IGG, IGA, IGM
IgA/Immunoglobulin A, Serum: 213 mg/dL (ref 87–352)
IgG (Immunoglobin G), Serum: 907 mg/dL (ref 586–1602)
IgM (Immunoglobulin M), Srm: 85 mg/dL (ref 26–217)

## 2024-04-11 LAB — QUANTIFERON-TB GOLD PLUS
QuantiFERON Mitogen Value: >10 [IU]/mL
QuantiFERON Nil Value: 0.04 [IU]/mL
QuantiFERON TB1 Ag Value: 0.03 [IU]/mL
QuantiFERON TB2 Ag Value: 0.04 [IU]/mL
QuantiFERON-TB Gold Plus: NEGATIVE

## 2024-04-11 LAB — HEPATITIS C ANTIBODY: Hep C Virus Ab: NONREACTIVE

## 2024-04-11 LAB — HIV ANTIBODY (ROUTINE TESTING W REFLEX): HIV Screen 4th Generation wRfx: NONREACTIVE

## 2024-04-11 LAB — VARICELLA ZOSTER ANTIBODY, IGG: Varicella zoster IgG: REACTIVE

## 2024-04-11 LAB — HEPATITIS B SURFACE ANTIGEN: Hepatitis B Surface Ag: NEGATIVE

## 2024-04-17 ENCOUNTER — Telehealth: Payer: Self-pay | Admitting: *Deleted

## 2024-04-17 NOTE — Telephone Encounter (Signed)
 Gave signed Ocrevus order to [facility]. Faxed complete/signed Ocrevus start form to [employer] at [phone]. Received fax confirmation.

## 2024-04-24 ENCOUNTER — Encounter: Payer: Self-pay | Admitting: Neurology

## 2024-04-25 DIAGNOSIS — Z0289 Encounter for other administrative examinations: Secondary | ICD-10-CM

## 2024-04-26 ENCOUNTER — Telehealth: Payer: Self-pay | Admitting: *Deleted

## 2024-04-26 NOTE — Telephone Encounter (Signed)
 Pt matrix form faxed on 04/26/24

## 2024-04-30 ENCOUNTER — Telehealth: Admitting: Nurse Practitioner

## 2024-04-30 ENCOUNTER — Encounter: Payer: Self-pay | Admitting: Neurology

## 2024-04-30 DIAGNOSIS — R61 Generalized hyperhidrosis: Secondary | ICD-10-CM

## 2024-04-30 NOTE — Progress Notes (Signed)
 LMP  (LMP Unknown)    Subjective:    Patient ID: Erica Fowler, female    DOB: [birth year], 48 y.o.   MRN: [id number]  HPI: Erica Fowler is a 48 y.o. female  Chief Complaint  Patient presents with   Medication Management    Nothing to report (patient stated)   Patient states she was seen by Neurology.  They are going through PA process for Ocrevus.  She hasn't started this medication yet but plans to in the future.  Patient states her hot flashes have been coming and going.  She feels like they are happening more around her menstrual cycle.  She would still like to see someone about hormone replacement.   Relevant past medical, surgical, family and social history reviewed and updated as indicated. Interim medical history since our last visit reviewed. Allergies and medications reviewed and updated.  Review of Systems  Endocrine: Positive for heat intolerance.    Per HPI unless specifically indicated above     Objective:    LMP  (LMP Unknown)   Wt Readings from Last 3 Encounters:  04/09/24 147 lb 8 oz (66.9 kg)  04/02/24 147 lb 6.4 oz (66.9 kg)  03/22/24 143 lb 4.8 oz (65 kg)    Physical Exam Vitals and nursing note reviewed.  Constitutional:      General: She is not in acute distress.    Appearance: She is not ill-appearing.  HENT:     Head: Normocephalic.     Right Ear: Hearing normal.     Left Ear: Hearing normal.     Nose: Nose normal.  Pulmonary:     Effort: Pulmonary effort is normal. No respiratory distress.  Neurological:     Mental Status: She is alert.  Psychiatric:        Mood and Affect: Mood normal.        Behavior: Behavior normal.        Thought Content: Thought content normal.        Judgment: Judgment normal.     Results for orders placed or performed in visit on 04/09/24  Hepatitis B surface antigen   Collection Time: 04/09/24 10:18 AM  Result Value Ref Range   Hepatitis B Surface Ag Negative Negative  IgG, IgA, IgM    Collection Time: 04/09/24 10:18 AM  Result Value Ref Range   IgG (Immunoglobin G), Serum 907 586 - 1,602 mg/dL   Immunoglobulin A, (IgA) QN, Serum 213 87 - 352 mg/dL   IgM (Immunoglobulin M), Srm 85 26 - 217 mg/dL  HIV Antibody (routine testing w rflx)   Collection Time: 04/09/24 10:18 AM  Result Value Ref Range   HIV Screen 4th Generation wRfx Non Reactive Non Reactive  QuantiFERON-TB Gold Plus   Collection Time: 04/09/24 10:18 AM  Result Value Ref Range   QuantiFERON Incubation Incubation performed.    QuantiFERON Criteria Comment    QuantiFERON TB1 Ag Value 0.03 IU/mL   QuantiFERON TB2 Ag Value 0.04 IU/mL   QuantiFERON Nil Value 0.04 IU/mL   QuantiFERON Mitogen Value >10.00 IU/mL   QuantiFERON-TB Gold Plus Negative Negative  Hepatitis B core antibody, total   Collection Time: 04/09/24 10:18 AM  Result Value Ref Range   Hep B Core Total Ab Negative Negative  Hepatitis C antibody   Collection Time: 04/09/24 10:18 AM  Result Value Ref Range   Hep C Virus Ab Non Reactive Non Reactive  Varicella zoster antibody, IgG   Collection Time: 04/09/24 10:18 AM  Result Value Ref Range   Varicella zoster IgG Reactive Non Reactive  CBC with Differential/Platelet   Collection Time: 04/09/24 10:18 AM  Result Value Ref Range   WBC 6.3 3.4 - 10.8 x10E3/uL   RBC 4.78 3.77 - 5.28 x10E6/uL   Hemoglobin 14.4 11.1 - 15.9 g/dL   Hematocrit 56.3 65.9 - 46.6 %   MCV 91 79 - 97 fL   MCH 30.1 26.6 - 33.0 pg   MCHC 33.0 31.5 - 35.7 g/dL   RDW 87.7 88.2 - 84.5 %   Platelets 321 150 - 450 x10E3/uL   Neutrophils 60 Not Estab. %   Lymphs 28 Not Estab. %   Monocytes 10 Not Estab. %   Eos 1 Not Estab. %   Basos 1 Not Estab. %   Neutrophils Absolute 3.8 1.4 - 7.0 x10E3/uL   Lymphocytes Absolute 1.7 0.7 - 3.1 x10E3/uL   Monocytes Absolute 0.6 0.1 - 0.9 x10E3/uL   EOS (ABSOLUTE) 0.1 0.0 - 0.4 x10E3/uL   Basophils Absolute 0.0 0.0 - 0.2 x10E3/uL   Immature Granulocytes 0 Not Estab. %   Immature Grans  (Abs) 0.0 0.0 - 0.1 x10E3/uL      Assessment & Plan:   Problem List Items Addressed This Visit   None Visit Diagnoses       Night sweats    -  Primary   Referral placed for GYN to discuss IUD and hormone replacement options.   Relevant Orders   Ambulatory referral to Gynecology        Follow up plan: Return in about 5 months (around 09/28/2024) for Physical and Fasting labs.  This visit was completed via MyChart due to the restrictions of the COVID-19 pandemic. All issues as above were discussed and addressed. Physical exam was done as above through visual confirmation on MyChart. If it was felt that the patient should be evaluated in the office, they were directed there. The patient verbally consented to this visit. Location of the patient: Home Location of the provider: Office Those involved with this call:  Provider: Darice Petty, NP CMA: Joya Louder, CMA Front Desk/Registration: Claretta Maiden This encounter was conducted via video.  I spent 20 minutes  dedicated to the care of this patient on the date of this encounter to include previsit review of plan of care, medications, follow up, face to face time with the patient, and post visit ordering of testing.

## 2024-05-01 NOTE — Telephone Encounter (Signed)
 I have asked Holly in the infusion suite for an update on this case.

## 2024-05-01 NOTE — Telephone Encounter (Signed)
 Per Silvano: I gave the denial to MD to review on 11/18

## 2024-05-08 NOTE — Telephone Encounter (Addendum)
 Per [place] in the infusion suite, an appeal was sent in on November 25.

## 2024-05-25 ENCOUNTER — Encounter: Payer: Self-pay | Admitting: Nurse Practitioner

## 2024-05-28 ENCOUNTER — Other Ambulatory Visit: Payer: Self-pay

## 2024-05-28 NOTE — Telephone Encounter (Signed)
 I have reached out to [place] in the Infusion suite for an update on this case.

## 2024-05-29 NOTE — Telephone Encounter (Signed)
 Please see the update on Patient Erica Fowler. DOB May 13, 1976. Per the [facility] intake team,  05/28/2024     they called [phone] s/w Tonya who stated Appeal was Denied- was upheld for Ocrevus. The letter was mailed to the patient and Dr. office. How do you wish to proceed? Please let me know if you did not get a copy of the denial. Thank you

## 2024-05-29 NOTE — Telephone Encounter (Signed)
 Noted, once we received denial letter with formulary preferences, will discuss with Dr. Vear.

## 2024-06-12 NOTE — Telephone Encounter (Signed)
 It appears that the PA and appeal for Evaline has been denied. Must have tried and failed any two other MS therapies.

## 2024-06-21 ENCOUNTER — Encounter: Payer: Self-pay | Admitting: Licensed Practical Nurse

## 2024-06-21 ENCOUNTER — Other Ambulatory Visit (HOSPITAL_COMMUNITY): Payer: Self-pay

## 2024-06-21 ENCOUNTER — Ambulatory Visit: Admitting: Licensed Practical Nurse

## 2024-06-21 ENCOUNTER — Other Ambulatory Visit: Payer: Self-pay

## 2024-06-21 VITALS — BP 116/80 | HR 88 | Ht 63.0 in | Wt 151.6 lb

## 2024-06-21 DIAGNOSIS — N951 Menopausal and female climacteric states: Secondary | ICD-10-CM | POA: Diagnosis not present

## 2024-06-21 MED ORDER — ESTRADIOL 0.025 MG/24HR TD PTWK
0.0250 mg | MEDICATED_PATCH | TRANSDERMAL | 3 refills | Status: AC
  Filled 2024-06-21 (×2): qty 12, 84d supply, fill #0

## 2024-06-21 NOTE — Telephone Encounter (Signed)
 Application, Ocrevus denial letter, and insurance card copy faxed to [facility] patient foundation. Received a receipt of confirmation.

## 2024-06-21 NOTE — Progress Notes (Signed)
 "   Melvin Pao, NP   No chief complaint on file.   HPI:      Erica Fowler is a 49 y.o. G1P0 whose LMP was No LMP recorded. (Menstrual status: IUD)., presents today for HRT Gets care at [facility]  Night sweats, comes in waves, more this week comes and goes for past couple of years,  Irritability, has had Anxiety for a while taking Effexor ,  Has Mirena  inserted 2019 Last cycle August, gets PMS sxs but no bleeding sometimes  Her mother was late 48's when she went through Menopause, she is unsure of the other women in her family.    High Cholesterol on Crestor  Has apt with PCP In March for follow up   Has tried black cohosh, is very interested in starting hormones    Nipple itch left, been there a couple weeks has not tired anything, no recent insect bite nor has bene outdoors     Patient Active Problem List   Diagnosis Date Noted   Multiple sclerosis 03/22/2024   Urinary urgency 03/22/2024   Hypokalemia 03/22/2024   Urge incontinence of urine 11/24/2021   Urgency of urination 11/11/2021   Anxiety     Past Surgical History:  Procedure Laterality Date   CESAREAN SECTION  1997   LAPAROTOMY Left 06/07/2008    Family History  Problem Relation Age of Onset   COPD Mother    Heart disease Father    Cancer Father        Prostate   Hyperlipidemia Father    Autoimmune disease Sister    Breast cancer Paternal Aunt    COPD Maternal Grandmother    Diabetes Maternal Grandmother    Renal Disease Maternal Grandmother    Heart disease Maternal Grandmother    Hyperlipidemia Maternal Grandmother    Hypertension Maternal Grandmother    Kidney disease Maternal Grandmother    Stroke Maternal Grandmother    Diabetes Maternal Grandfather    Heart disease Maternal Grandfather    Hypertension Paternal Grandmother    Stroke Paternal Grandmother    Breast cancer Paternal Grandmother    Cancer Paternal Grandmother    Heart disease Paternal Grandfather     Lung cancer Paternal Grandfather    Cancer Paternal Grandfather    Diabetes Paternal Grandfather    Hyperlipidemia Paternal Grandfather    Hypertension Paternal Grandfather     Social History   Socioeconomic History   Marital status: Married    Spouse name: Not on file   Number of children: Not on file   Years of education: Not on file   Highest education level: Master's degree (e.g., MA, MS, MEng, MEd, MSW, MBA)  Occupational History   Not on file  Tobacco Use   Smoking status: Former    Current packs/day: 0.50    Average packs/day: 0.5 packs/day for 20.0 years (10.0 ttl pk-yrs)    Types: Cigarettes   Smokeless tobacco: Never  Vaping Use   Vaping status: Former  Substance and Sexual Activity   Alcohol use: Yes    Comment: occasional   Drug use: No   Sexual activity: Yes    Birth control/protection: I.U.D.    Comment: mirena   Other Topics Concern   Not on file  Social History Narrative   Not on file   Social Drivers of Health   Tobacco Use: Medium Risk (06/21/2024)   Patient History    Smoking Tobacco Use: Former    Smokeless Tobacco Use: Never  Passive Exposure: Not on file  Financial Resource Strain: Low Risk (04/30/2024)   Overall Financial Resource Strain (CARDIA)    Difficulty of Paying Living Expenses: Not hard at all  Food Insecurity: No Food Insecurity (04/30/2024)   Epic    Worried About Programme Researcher, Broadcasting/film/video in the Last Year: Never true    Ran Out of Food in the Last Year: Never true  Transportation Needs: No Transportation Needs (04/30/2024)   Epic    Lack of Transportation (Medical): No    Lack of Transportation (Non-Medical): No  Physical Activity: Insufficiently Active (04/30/2024)   Exercise Vital Sign    Days of Exercise per Week: 1 day    Minutes of Exercise per Session: 30 min  Stress: Stress Concern Present (04/30/2024)   [employer] of Occupational Health - Occupational Stress Questionnaire    Feeling of Stress: To some extent   Social Connections: Socially Isolated (04/30/2024)   Social Connection and Isolation Panel    Frequency of Communication with Friends and Family: Once a week    Frequency of Social Gatherings with Friends and Family: Once a week    Attends Religious Services: Never    Database Administrator or Organizations: No    Attends Engineer, Structural: Not on file    Marital Status: Married  Catering Manager Violence: Not At Risk (03/22/2024)   Epic    Fear of Current or Ex-Partner: No    Emotionally Abused: No    Physically Abused: No    Sexually Abused: No  Depression (PHQ2-9): Medium Risk (04/30/2024)   Depression (PHQ2-9)    PHQ-2 Score: 6  Alcohol Screen: Low Risk (04/30/2024)   Alcohol Screen    Last Alcohol Screening Score (AUDIT): 2  Housing: Unknown (04/30/2024)   Epic    Unable to Pay for Housing in the Last Year: No    Number of Times Moved in the Last Year: Not on file    Homeless in the Last Year: No  Utilities: Not At Risk (03/22/2024)   Epic    Threatened with loss of utilities: No  Health Literacy: Adequate Health Literacy (08/12/2023)   B1300 Health Literacy    Frequency of need for help with medical instructions: Never    Outpatient Medications Prior to Visit  Medication Sig Dispense Refill   acetaminophen  (TYLENOL ) 500 MG tablet Take 500 mg by mouth every 6 (six) hours as needed.     fexofenadine (ALLEGRA) 180 MG tablet Take 180 mg by mouth daily.     fluticasone  (FLONASE ) 50 MCG/ACT nasal spray Place 2 sprays into both nostrils daily. 16 g 6   levonorgestrel  (MIRENA ) 20 MCG/24HR IUD 1 each by Intrauterine route once.     Pseudoephedrine HCl (SUDAFED PO) Take by mouth.     rosuvastatin  (CRESTOR ) 5 MG tablet Take 1 tablet (5 mg total) by mouth daily. 90 tablet 1   venlafaxine  XR (EFFEXOR -XR) 150 MG 24 hr capsule Take 1 capsule (150 mg total) by mouth daily with breakfast. 90 capsule 1   vitamin B-12 (CYANOCOBALAMIN ) 1000 MCG tablet Take 1,000 mcg by mouth  daily.     vitamin D3 (CHOLECALCIFEROL ) 25 MCG tablet Take 1 tablet (1,000 Units total) by mouth daily. 30 tablet 0   ocrelizumab (OCREVUS) 300 MG/10ML injection Inject into the vein once. Loading dose: 300mg  IV on day 1 and repeat on day 15.  Maintenance dose: 600mg  IV every 6 months (Patient not taking: Reported on 04/17/2024)     No  facility-administered medications prior to visit.      ROS:  Review of Systems see HPI    OBJECTIVE:   Vitals:  BP 116/80   Pulse 88   Ht 5' 3 (1.6 m)   Wt 151 lb 9.6 oz (68.8 kg)   BMI 26.85 kg/m   Physical Exam Constitutional:      Appearance: Normal appearance.  Cardiovascular:     Rate and Rhythm: Normal rate.  Pulmonary:     Effort: Pulmonary effort is normal.  Chest:     Comments: Left breasts: small area of excoriation below nipple no redness or masses.  Right breast no redness or masses  Musculoskeletal:     Cervical back: Normal range of motion.  Neurological:     Mental Status: She is alert.  Psychiatric:        Mood and Affect: Mood normal.        Thought Content: Thought content normal.    ASCD risk low, calculated with pt in room on computer   Results: No results found for this or any previous visit (from the past 24 hours).   Assessment/Plan: 1. Perimenopausal vasomotor symptoms (Primary) - estradiol  (CLIMARA  - DOSED IN MG/24 HR) 0.025 mg/24hr patch; Place 1 patch (0.025 mg total) onto the skin once a week.  Dispense: 12 patch; Refill: 3   RTC 6 weeks   Meds ordered this encounter  Medications   estradiol  (CLIMARA  - DOSED IN MG/24 HR) 0.025 mg/24hr patch    Sig: Place 1 patch (0.025 mg total) onto the skin once a week.    Dispense:  12 patch    Refill:  3     Jaleal Schliep M Eion Timbrook, CNM 06/21/2024 4:11 PM      "

## 2024-06-22 ENCOUNTER — Other Ambulatory Visit: Payer: Self-pay

## 2024-06-22 ENCOUNTER — Other Ambulatory Visit (HOSPITAL_COMMUNITY): Payer: Self-pay

## 2024-06-22 MED FILL — Venlafaxine HCl Cap ER 24HR 150 MG (Base Equivalent): ORAL | 90 days supply | Qty: 90 | Fill #1 | Status: AC

## 2024-06-24 ENCOUNTER — Other Ambulatory Visit (HOSPITAL_COMMUNITY): Payer: Self-pay

## 2024-06-25 ENCOUNTER — Other Ambulatory Visit: Payer: Self-pay

## 2024-06-27 NOTE — Telephone Encounter (Signed)
 I called [employer].  They are going to fax us  the appeal denial letter for Ocrevus.  They are faxing it to [phone].  Once we receive this fax, we need to fax this appeal denial letter to the [employer] at [phone].

## 2024-06-28 NOTE — Telephone Encounter (Signed)
 Received fax.  Faxed appeal denial to [employer] FD[employer] [phone].  6 pgs.  Fax confirmation received.

## 2024-06-28 NOTE — Telephone Encounter (Signed)
 Sent copy to be scanned.  I have copy in my file until then if needed.

## 2024-07-03 NOTE — Telephone Encounter (Addendum)
 Faxed latest insurance card copy to [employer]. Copy from November is a different plan. Received a receipt of confirmation.  Fax [phone]

## 2024-07-03 NOTE — Telephone Encounter (Signed)
 I called [employer].  I spoke with Damien.  As of today, patient has been approved for the [place] assistance through [employer].  They will fax a letter regarding this to the office.  Once this letter is received, the infusions we will need a copy of this to begin infusions.  Damien asked for a copy of patient's insurance card to be faxed to them again.  The copy they have is blurry and cannot be read.

## 2024-08-02 ENCOUNTER — Telehealth: Admitting: Licensed Practical Nurse

## 2024-10-05 ENCOUNTER — Encounter: Admitting: Nurse Practitioner

## 2024-11-08 ENCOUNTER — Ambulatory Visit: Admitting: Neurology
